# Patient Record
Sex: Female | Born: 1985
Health system: Southern US, Community
[De-identification: ages and names within clinical notes are randomized; demographics above are authoritative.]

## PROBLEM LIST (undated history)

## (undated) DIAGNOSIS — N920 Excessive and frequent menstruation with regular cycle: Secondary | ICD-10-CM

## (undated) DIAGNOSIS — L2089 Other atopic dermatitis: Secondary | ICD-10-CM

## (undated) DIAGNOSIS — D649 Anemia, unspecified: Secondary | ICD-10-CM

## (undated) DIAGNOSIS — E8809 Other disorders of plasma-protein metabolism, not elsewhere classified: Secondary | ICD-10-CM

## (undated) DIAGNOSIS — N1330 Unspecified hydronephrosis: Secondary | ICD-10-CM

## (undated) DIAGNOSIS — I10 Essential (primary) hypertension: Secondary | ICD-10-CM

## (undated) DIAGNOSIS — D219 Benign neoplasm of connective and other soft tissue, unspecified: Secondary | ICD-10-CM

## (undated) HISTORY — DX: Anemia, unspecified: D64.9

## (undated) HISTORY — DX: Benign neoplasm of connective and other soft tissue, unspecified: D21.9

## (undated) HISTORY — DX: Excessive and frequent menstruation with regular cycle: N92.0

---
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
## 2006-10-24 HISTORY — PX: WISDOM TOOTH EXTRACTION: SHX21

## 2010-04-22 ENCOUNTER — Emergency Department (HOSPITAL_COMMUNITY): Admission: EM | Admit: 2010-04-22 | Discharge: 2010-04-22 | Payer: Self-pay | Admitting: Family Medicine

## 2011-12-30 ENCOUNTER — Emergency Department (HOSPITAL_COMMUNITY)
Admission: EM | Admit: 2011-12-30 | Discharge: 2011-12-30 | Disposition: A | Discharge status: Home Health | Payer: 59 | Source: Home / Self Care | Attending: Emergency Medicine | Admitting: Emergency Medicine

## 2011-12-30 ENCOUNTER — Encounter (HOSPITAL_COMMUNITY): Payer: Self-pay | Admitting: Emergency Medicine

## 2011-12-30 DIAGNOSIS — N760 Acute vaginitis: Secondary | ICD-10-CM

## 2011-12-30 LAB — WET PREP, GENITAL: Trich, Wet Prep: NONE SEEN

## 2011-12-30 MED ORDER — METRONIDAZOLE 500 MG PO TABS: 500.0000 mg | ORAL_TABLET | Freq: Two times a day (BID) | ORAL | Status: AC

## 2011-12-30 MED ORDER — FLUCONAZOLE 150 MG PO TABS: ORAL_TABLET | ORAL | Status: AC

## 2011-12-30 NOTE — Discharge Instructions (Signed)
Take the medication as written. Give us a working phone number so that we can contact you if needed. Refrain from sexual contact until you know your results and your partner(s) are treated. Return if you get worse, have a fever >100.4, or for any concerns.  ° °Go to www.goodrx.com to look up your medications. This will give you a list of where you can find your prescriptions at the most affordable prices.  °

## 2011-12-30 NOTE — ED Provider Notes (Signed)
History     CSN: [id number]  Arrival date & time 12/30/11  0846   First MD Initiated Contact with Patient 12/30/11 [phone]      Chief Complaint  Patient presents with  . Vaginal Discharge    (Consider location/radiation/quality/duration/timing/severity/associated sxs/prior treatment) HPI Comments: Pt with 7 days vaginal itching, nonodorous vaginal discharge,  No urgency, frequency, dysuria, oderous urine, hematuria,  genital blisters. No fevers, N/V, abd pain, back pain. States that she was using a new scented soap, but has since discontinued this. Tried Monistat vaginal suppositories for 3 days, with mild improvement No recent abx use. Pt sexually active with longterm female partner who is asxatic. STD's not a concern today. No h/o BV gonorrhea chlamydia trichmonoas yeast infection. No h/o syphilis, herpes, HIV.  ROS as noted in HPI. All other ROS negative.    Patient is a 26 y.o. female presenting with vaginal itching. The history is provided by the patient.  Vaginal Itching    History reviewed. No pertinent past medical history.  History reviewed. No pertinent past surgical history.  History reviewed. No pertinent family history.  History  Substance Use Topics  . Smoking status: Never Smoker   . Smokeless tobacco: Not on file  . Alcohol Use: No    OB History    Grav Para Term Preterm Abortions TAB SAB Ect Mult Living                  Review of Systems  Allergies  Review of patient's allergies indicates no known allergies.  Home Medications   Current Outpatient Rx  Name Route Sig Dispense Refill  . FLUCONAZOLE 150 MG PO TABS  1 tab po x 1. May repeat in 72 hours if no improvement 2 tablet 0  . METRONIDAZOLE 500 MG PO TABS Oral Take 1 tablet (500 mg total) by mouth 2 (two) times daily. X 7 days 14 tablet 0    BP 161/84  Pulse 80  Temp(Src) 98.1 F (36.7 C) (Oral)  Resp 18  SpO2 100%  LMP 12/09/2011  Physical Exam  Nursing note and vitals  reviewed. Constitutional: She is oriented to person, place, and time. She appears well-developed and well-nourished. No distress.  HENT:  Head: Normocephalic and atraumatic.  Eyes: EOM are normal.  Neck: Normal range of motion.  Cardiovascular: Normal rate.   Pulmonary/Chest: Effort normal.  Abdominal: Soft. Bowel sounds are normal. She exhibits no distension. There is no tenderness. There is no rebound and no guarding.  Genitourinary: Uterus normal. Pelvic exam was performed with patient supine. There is no rash on the right labia. There is no rash on the left labia. Uterus is not tender. Cervix exhibits no motion tenderness and no friability. Right adnexum displays no mass, no tenderness and no fullness. Left adnexum displays no mass, no tenderness and no fullness. No erythema, tenderness or bleeding around the vagina. No foreign body around the vagina. Vaginal discharge found.       Thin white nonoderous vaginal d/c.Chaperone present during exam  Musculoskeletal: Normal range of motion.  Neurological: She is alert and oriented to person, place, and time.  Skin: Skin is warm and dry.  Psychiatric: She has a normal mood and affect. Her behavior is normal. Judgment and thought content normal.    ED Course  Procedures (including critical care time)   Labs Reviewed  GC/CHLAMYDIA PROBE AMP, GENITAL  WET PREP, GENITAL   No results found.   1. Vaginitis       MDM  Records reviewed.. No previous labs available.  H&P most c/w  BV. Sent off GC/chlamydia, wet prep. Will not treat empirically now.  Will send home with flagyl  and diflucan for yeast infection if her symptoms do not clear with Flagyl. Advised pt to refrain from sexual contact until she knows lab results, symptoms resolve, and partner(s) are treated. Pt provided working phone number. Pt agrees.     Luiz Blare, MD 12/30/11 1025

## 2011-12-30 NOTE — ED Notes (Signed)
C/o vaginal irritation, vaginal discharge for 1 week.. Used monistat suppositories without relief.  No recent antibiotics.

## 2011-12-31 LAB — GC/CHLAMYDIA PROBE AMP, GENITAL
Chlamydia, DNA Probe: NEGATIVE
GC Probe Amp, Genital: NEGATIVE

## 2012-01-02 NOTE — ED Notes (Signed)
GC/Chlamydia neg., Wet prep: Few clue cells, WBC's TNTC. Pt. adequately treated with Flagyl.  Alicia Larson 01/02/2012

## 2012-03-29 ENCOUNTER — Encounter: Payer: Self-pay | Admitting: Family Medicine

## 2012-03-29 ENCOUNTER — Other Ambulatory Visit: Payer: Self-pay | Admitting: Family Medicine

## 2012-03-29 ENCOUNTER — Ambulatory Visit (INDEPENDENT_AMBULATORY_CARE_PROVIDER_SITE_OTHER): Payer: 59 | Admitting: Family Medicine

## 2012-03-29 VITALS — BP 136/83 | HR 100 | Temp 97.8°F | Resp 18 | Ht 67.0 in | Wt 147.8 lb

## 2012-03-29 DIAGNOSIS — Z01419 Encounter for gynecological examination (general) (routine) without abnormal findings: Secondary | ICD-10-CM

## 2012-03-29 DIAGNOSIS — R5381 Other malaise: Secondary | ICD-10-CM

## 2012-03-29 DIAGNOSIS — R5383 Other fatigue: Secondary | ICD-10-CM

## 2012-03-29 DIAGNOSIS — Z124 Encounter for screening for malignant neoplasm of cervix: Secondary | ICD-10-CM

## 2012-03-29 DIAGNOSIS — Z975 Presence of (intrauterine) contraceptive device: Secondary | ICD-10-CM

## 2012-03-29 DIAGNOSIS — Z0000 Encounter for general adult medical examination without abnormal findings: Secondary | ICD-10-CM

## 2012-03-29 LAB — LIPID PANEL
LDL Cholesterol: 77 mg/dL (ref 0–99)
VLDL: 6 mg/dL (ref 0–40)

## 2012-03-29 LAB — POCT URINALYSIS DIPSTICK
Bilirubin, UA: NEGATIVE
Blood, UA: NEGATIVE
Glucose, UA: NEGATIVE
Ketones, UA: NEGATIVE
Leukocytes, UA: NEGATIVE
Nitrite, UA: NEGATIVE
pH, UA: 5.5

## 2012-03-29 LAB — CBC WITH DIFFERENTIAL/PLATELET
Basophils Absolute: 0 10*3/uL (ref 0.0–0.1)
Basophils Relative: 1 % (ref 0–1)
Hemoglobin: 7.2 g/dL — ABNORMAL LOW (ref 12.0–15.0)
MCHC: 27.4 g/dL — ABNORMAL LOW (ref 30.0–36.0)
Monocytes Relative: 12 % (ref 3–12)
Neutro Abs: 3.4 10*3/uL (ref 1.7–7.7)
Neutrophils Relative %: 57 % (ref 43–77)
Platelets: 256 10*3/uL (ref 150–400)
RDW: 22.8 % — ABNORMAL HIGH (ref 11.5–15.5)

## 2012-03-29 LAB — COMPREHENSIVE METABOLIC PANEL
AST: 20 U/L (ref 0–37)
Albumin: 4.5 g/dL (ref 3.5–5.2)
Alkaline Phosphatase: 72 U/L (ref 39–117)
Potassium: 3.9 mEq/L (ref 3.5–5.3)
Sodium: 136 mEq/L (ref 135–145)
Total Bilirubin: 0.4 mg/dL (ref 0.3–1.2)
Total Protein: 8.2 g/dL (ref 6.0–8.3)

## 2012-03-29 NOTE — Patient Instructions (Signed)
Vitamin D Deficiency  Not having enough vitamin D is called a deficiency. Your body needs this vitamin to keep your bones strong and healthy. Having too little of it can make your bones soft or can cause other health problems.  HOME CARE  Take all vitamins, herbs, or nutrition drinks (supplements) as told by your doctor.   Have your blood tested 2 months after taking vitamins, herbs, or nutrition drinks.   Eat foods that have vitamin D. This includes:   Dairy products, cereals, or juices with added vitamin D. Check the label.   Fatty fish like salmon or trout.   Eggs.   Oysters.   Go outside for 10 to 15 minutes when the sun is shining. Do this 3 times a week. Do not do this if you have skin cancer.   Do not use tanning beds.   Stay at a healthy weight. Lose weight if needed.   Keep all doctor visits as told.  GET HELP IF:  You have questions.   You continue to have problems.   You feel sick to your stomach (nauseous) or throw up (vomit).   You cannot go poop (constipated).   You feel confused.   You have severe belly (abdominal) or back pain.  MAKE SURE YOU:  Understand these instructions.   Will watch your condition.   Will get help right away if you are not doing well or get worse.  Document Released: 09/29/2011 Document Reviewed: 09/27/2011 [facility] Patient Information 2012 [place], [place]    .Keeping You Healthy  Get These Tests 1. Blood Pressure- Have your blood pressure checked once a year by your health care provider.  Normal blood pressure is 120/80. 2. Weight- Have your body mass index (BMI) calculated to screen for obesity.  BMI is measure of body fat based on height and weight.  You can also calculate your own BMI at https://www.west-esparza.com/. 3. Cholesterol- Have your cholesterol checked every 5 years starting at age 70 then yearly starting at age 69. 4. Chlamydia, HIV, and other sexually transmitted diseases- Get screened every year until age 40,  then within three months of each new sexual provider. 5. Pap Smear- Every 1-3 years; discuss with your health care provider. 6. Mammogram- Every year starting at age 43  Take these medicines  Calcium with Vitamin D-Your body needs 1200 mg of Calcium each day and [phone] IU of Vitamin D daily.  Your body can only absorb 500 mg of Calcium at a time so Calcium must be taken in 2 or 3 divided doses throughout the day.  Multivitamin with folic acid- Once daily if it is possible for you to become pregnant.   Start taking a multi-vitamin [employer] Made for Women with Iron  Is a good brand)  Get these Immunizations  Gardasil-Series of three doses; prevents HPV related illness such as genital warts and cervical cancer.  Menactra-Single dose; prevents meningitis.  Tetanus shot- Every 10 years.  Flu shot-Every year.  Take these steps 1. Do not smoke-Your healthcare provider can help you quit.  For tips on how to quit go to www.smokefree.gov or call 1-800 QUITNOW. 2. Be physically active- Exercise 5 days a week for at least 30 minutes.  If you are not already physically active, start slow and gradually work up to 30 minutes of moderate physical activity.  Examples of moderate activity include walking briskly, dancing, swimming, bicycling, etc. 3. Breast Cancer- A self breast exam every month is important for early detection of breast  cancer.  For more information and instruction on self breast exams, ask your healthcare provider or [email or web address]. 4. Eat a healthy diet- Eat a variety of healthy foods such as fruits, vegetables, whole grains, low fat milk, low fat cheeses, yogurt, lean meats, poultry and fish, beans, nuts, tofu, etc.  For more information go to www. Thenutritionsource.org 5. Drink alcohol in moderation- Limit alcohol intake to one drink or less per day. Never drink and drive. 6. Depression- Your emotional health is as important as your physical health.  If  you're feeling down or losing interest in things you normally enjoy please talk to your healthcare provider about being screened for depression. 7. Dental visit- Brush and floss your teeth twice daily; visit your dentist twice a year. 8. Eye doctor- Get an eye exam at least every 2 years. 9. Helmet use- Always wear a helmet when riding a bicycle, motorcycle, rollerblading or skateboarding. 10. Safe sex- If you may be exposed to sexually transmitted infections, use a condom. 11. Seat belts- Seat belts can save your live; always wear one. 12. Smoke/Carbon Monoxide detectors- These detectors need to be installed on the appropriate level of your home. Replace batteries at least once a year. 13. Skin cancer- When out in the sun please cover up and use sunscreen 15 SPF or higher. 14. Violence- If anyone is threatening or hurting you, please tell your healthcare provider.

## 2012-03-29 NOTE — Progress Notes (Signed)
Subjective:    Patient ID: Alicia Larson, female    DOB: 16-Sep-1986, 26 y.o.   MRN: [id number]  HPI   This 26 y.o. AA female is here for CPE/PAP, last PAP in 2009 in [place], [place]. 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 She c/o fatigue and occasional right knee discomfort that requires  no OTC pain medication or other treatment. No known injury. She is married; current contraception  is IUD Midwife). She does not smoke, consume alcohol or exercise.   Review of Systems  Constitutional: Positive for fatigue. Negative for fever, appetite change and unexpected weight change.  Respiratory: Negative for chest tightness and shortness of breath.   Cardiovascular: Negative for chest pain and palpitations.  Genitourinary: Negative for menstrual problem.  Musculoskeletal: Positive for arthralgias. Negative for joint swelling and gait problem.  Neurological: Negative for dizziness, weakness and headaches.  Hematological: Negative for adenopathy. Does not bruise/bleed easily.  All other systems reviewed and are negative.       Objective:   Physical Exam  Vitals reviewed. Constitutional: She is oriented to person, place, and time. She appears well-developed and well-nourished. No distress.  HENT:  Head: Normocephalic and atraumatic.  Right Ear: External ear normal.  Left Ear: External ear normal.  Nose: Nose normal.  Mouth/Throat: Oropharynx is clear and moist.  Eyes: Conjunctivae and EOM are normal. Pupils are equal, round, and reactive to light. No scleral icterus.  Neck: Normal range of motion. Neck supple. No thyromegaly present.  Cardiovascular: Normal rate, regular rhythm and normal heart sounds.  Exam reveals no gallop.   No murmur heard. Pulmonary/Chest: Effort normal and breath sounds normal. No respiratory distress. She has no wheezes. Right breast exhibits no inverted nipple, no mass, no nipple discharge, no skin change and no tenderness. Left breast exhibits no  inverted nipple, no mass, no nipple discharge, no skin change and no tenderness. Breasts are symmetrical.  Abdominal: Soft. Bowel sounds are normal. She exhibits no mass. There is no tenderness. There is no guarding.       No organomegaly  Genitourinary: Rectum normal, vagina normal and uterus normal. There is no rash, tenderness or lesion on the right labia. There is no rash, tenderness or lesion on the left labia. Cervix exhibits discharge. Cervix exhibits no motion tenderness and no friability. Right adnexum displays no mass, no tenderness and no fullness. Left adnexum displays no mass, no tenderness and no fullness. No tenderness or bleeding around the vagina. No foreign body around the vagina. No signs of injury around the vagina.       Scant discharge on cervix  Musculoskeletal: Normal range of motion. She exhibits no edema and no tenderness.  Lymphadenopathy:    She has no cervical adenopathy.  Neurological: She is alert and oriented to person, place, and time. She has normal reflexes. No cranial nerve deficit. She exhibits normal muscle tone. Coordination normal.  Skin: Skin is warm and dry. No erythema. No pallor.  Psychiatric: She has a normal mood and affect. Her behavior is normal. Judgment and thought content normal.   Results for orders placed in visit on 03/29/12  POCT URINALYSIS DIPSTICK      Component Value Range   Color, UA yellow     Clarity, UA clear     Glucose, UA neg     Bilirubin, UA neg     Ketones, UA neg     Spec Grav, UA 1.015     Blood, UA neg  pH, UA 5.5     Protein, UA neg     Urobilinogen, UA 0.2     Nitrite, UA neg     Leukocytes, UA Negative            Assessment & Plan:   1. Routine general medical examination at a health care facility  Comprehensive metabolic panel, Lipid panel, POCT urinalysis dipstick  2. Encounter for cervical Pap smear with pelvic exam  Pap IG, CT/NG w/ reflex HPV when ASC-U  3. Fatigue - advised a good MVI (i.e. [employer]  Made for Women with Iron) CBC with Differential, Vitamin D, 25-hydroxy

## 2012-03-30 ENCOUNTER — Other Ambulatory Visit: Payer: Self-pay | Admitting: Family Medicine

## 2012-03-30 DIAGNOSIS — D649 Anemia, unspecified: Secondary | ICD-10-CM

## 2012-03-30 LAB — IRON AND TIBC
%SAT: 2 % — ABNORMAL LOW (ref 20–55)
TIBC: 517 ug/dL — ABNORMAL HIGH (ref 250–470)
UIBC: 505 ug/dL — ABNORMAL HIGH (ref 125–400)

## 2012-03-30 LAB — RETICULOCYTES: ABS Retic: 48.2 10*3/uL (ref 19.0–186.0)

## 2012-03-30 MED ORDER — POLYSACCHARIDE IRON COMPLEX 150 MG PO CAPS: 150.0000 mg | ORAL_CAPSULE | Freq: Two times a day (BID) | ORAL | Status: DC

## 2012-03-30 MED ORDER — ERGOCALCIFEROL 1.25 MG (50000 UT) PO CAPS: 50000.0000 [IU] | ORAL_CAPSULE | ORAL | Status: AC

## 2012-03-30 NOTE — Progress Notes (Signed)
Quick Note:  Please call pt and advise that the following labs are abnormal...  Your are very anemic. We are ordering additional tests to see if this is an iron-deficiency anemia. I am prescribing iron supplement to be taken twice a day and CBC needs to be rechecked in 1 month.  Vitamin D level is very low. Also need to take Vit D3 50,000 IU 1 capsule once a week through the summer.  Other labs are normal.  Copy to pt.  ______

## 2012-03-30 NOTE — Progress Notes (Signed)
Pt.notified

## 2012-04-02 LAB — PAP IG, CT-NG, RFX HPV ASCU

## 2012-04-03 NOTE — Progress Notes (Signed)
Quick Note:  Please call pt and advise that the following labs are abnormal... Additional labs indicate iron-deficiency anemia. Take Iron supplement daily as directed and increase iron-rich foods in diet.   Copy to pt. ______

## 2012-04-03 NOTE — Progress Notes (Signed)
Quick Note:  Notify pt of Normal results. ______ 

## 2012-07-06 ENCOUNTER — Telehealth: Payer: Self-pay

## 2012-07-06 ENCOUNTER — Ambulatory Visit (INDEPENDENT_AMBULATORY_CARE_PROVIDER_SITE_OTHER): Payer: 59 | Admitting: Family Medicine

## 2012-07-06 ENCOUNTER — Encounter: Payer: Self-pay | Admitting: Family Medicine

## 2012-07-06 VITALS — BP 131/78 | HR 82 | Temp 96.7°F | Resp 16 | Ht 67.0 in | Wt 150.0 lb

## 2012-07-06 DIAGNOSIS — B354 Tinea corporis: Secondary | ICD-10-CM

## 2012-07-06 MED ORDER — KETOCONAZOLE-HYDROCORTISONE 2 & 1 % EX KIT: 1.0000 "application " | PACK | Freq: Every day | CUTANEOUS | Status: DC

## 2012-07-06 NOTE — Progress Notes (Signed)
  Subjective:    Patient ID: Alicia Larson, female    DOB: 03/23/1986, 26 y.o.   MRN: [id number]  HPI   For the past mo, pt has gradually developed a dry rash - started w/ several spots on her breasts. She thought it was just dry skin so aggressively moisturized but then developed spots on her abdomen. Now having worst areas on her thighs and upper back. Just seem to be dry rough spots but will get itchy if not moisturized. No h/o prior. No pets. Husband w/o similar rash. Has not tried any otc products other than hydrating lotions. Tried changing her soaps and detergents w/o improvement. 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   Review of Systems  Constitutional: Negative for fever and chills.  Musculoskeletal: Negative for joint swelling and arthralgias.  Skin: Positive for rash. Negative for color change.       Objective:   Physical Exam  Constitutional: She is oriented to person, place, and time. She appears well-developed and well-nourished. No distress.  HENT:  Head: Normocephalic and atraumatic.  Eyes: Pupils are equal, round, and reactive to light. No scleral icterus.  Pulmonary/Chest: Effort normal.  Neurological: She is alert and oriented to person, place, and time.  Skin: Skin is warm and dry. Rash noted. Rash is macular. She is not diaphoretic. No erythema. No pallor.       Diffuse, well-circumscribed round mildly hyperpigmented macules w/ scale, some w/ central clearing. Most prominent on low and upper back and thigh. On breast and abdomen appeared to be resolving. Ranging in size from 0.5-2cm dm   Psychiatric: She has a normal mood and affect. Her behavior is normal.          Assessment & Plan:  1. Tinea corporis - KOH skin scraping + for hyphae. Treat w/ topical antifungal - ketoconazole qd x 6 wks.

## 2012-07-06 NOTE — Telephone Encounter (Signed)
PATIENT CALLED TODAY AND SAID [facility] NEEDS CLARIFICATION ON MEDICATION THAT WAS PRESCRIBED BY DR. SHAW  TO PATIENT. PLEASE GET IN CONTACT WITH PHARMACY IN RAGARDS TO THIS. THEY HAVE CLAIMED TO FAX INFO ON RX BUT THERE HAS BEEN NO FAX TODAY SENT TO [facility]. THANK YOU!

## 2012-07-07 ENCOUNTER — Other Ambulatory Visit: Payer: Self-pay

## 2012-07-07 NOTE — Telephone Encounter (Signed)
Spoke with pharmacist clarified medication, per Alycia Rossetti ok to change to ketaconazole 2% cream

## 2012-07-10 NOTE — Progress Notes (Signed)
Reviewed and agree.

## 2012-08-17 ENCOUNTER — Ambulatory Visit (INDEPENDENT_AMBULATORY_CARE_PROVIDER_SITE_OTHER): Payer: 59 | Admitting: Family Medicine

## 2012-08-17 ENCOUNTER — Encounter: Payer: Self-pay | Admitting: Family Medicine

## 2012-08-17 VITALS — BP 122/62 | HR 84 | Temp 98.6°F | Resp 16 | Ht 66.5 in | Wt 148.6 lb

## 2012-08-17 DIAGNOSIS — B360 Pityriasis versicolor: Secondary | ICD-10-CM

## 2012-08-17 DIAGNOSIS — B351 Tinea unguium: Secondary | ICD-10-CM

## 2012-08-17 DIAGNOSIS — R21 Rash and other nonspecific skin eruption: Secondary | ICD-10-CM

## 2012-08-17 LAB — POCT SKIN KOH: Skin KOH, POC: POSITIVE

## 2012-08-17 MED ORDER — FLUCONAZOLE 200 MG PO TABS: 400.0000 mg | ORAL_TABLET | Freq: Once | ORAL | Status: DC

## 2012-08-17 MED ORDER — KETOCONAZOLE 2 % EX CREA: TOPICAL_CREAM | CUTANEOUS | Status: DC

## 2012-08-17 NOTE — Progress Notes (Signed)
Subjective:    Patient ID: Alicia Larson, female    DOB: Dec 12, 1985, 26 y.o.   MRN: [id number] Chief Complaint  Patient presents with  . Follow-up    RASH    HPI  Rash seems to be spreading. She has new spots now on bilateral lower ext extending to her feet. Over her trunk too but worse on her legs.  She has been using the ketoconazole/hydrocortisone cream daily - which I started her on about 6 wks ago after a skin scraping was + for hyphae which initially seemed to be helping but now much worse.  No past medical history on file. Current Outpatient Prescriptions on File Prior to Visit  Medication Sig Dispense Refill  . ergocalciferol (DRISDOL) 50000 UNITS capsule Take 1 capsule (50,000 Units total) by mouth once a week.  4 capsule  4  . iron polysaccharides (NU-IRON) 150 MG capsule Take 1 capsule (150 mg total) by mouth 2 (two) times daily.  60 capsule  3   No current facility-administered medications on file prior to visit.   No Known Allergies  Review of Systems  Constitutional: Negative for fever, chills and diaphoresis.  Musculoskeletal: Negative for joint swelling and arthralgias.  Skin: Positive for color change and rash. Negative for pallor and wound.  Hematological: Negative for adenopathy. Does not bruise/bleed easily.  Psychiatric/Behavioral: Positive for sleep disturbance.      BP 122/62  Pulse 84  Temp(Src) 98.6 F (37 C) (Oral)  Resp 16  Ht 5' 6.5" (1.689 m)  Wt 148 lb 9.6 oz (67.405 kg)  BMI 23.63 kg/m2  SpO2 100%  LMP 08/16/2012 Objective:   Physical Exam  Constitutional: She is oriented to person, place, and time. She appears well-developed and well-nourished. No distress.  HENT:  Head: Normocephalic and atraumatic.  Right Ear: External ear normal.  Eyes: Conjunctivae are normal. No scleral icterus.  Pulmonary/Chest: Effort normal.  Neurological: She is alert and oriented to person, place, and time.  Skin: Skin is warm and dry. She is not diaphoretic.  No erythema.  Diffuse, well-circumscribed round mildly hyperpigmented macules w/ scale, some w/ central clearing. Most prominent on low and upper back and thigh though spread over entire bilateral low ext. On breast and abdomen and trunk appeared to be resolving. Ranging in size from 0.5-2cm dm. Did not light up sig under wood's lamp.    Psychiatric: She has a normal mood and affect. Her behavior is normal.      Results for orders placed in visit on 08/17/12  POCT SKIN KOH      Result Value Range   Skin KOH, POC Positive      Assessment & Plan:  Rash - Plan: POCT Skin KOH  Tinea versicolor - Plan: fluconazole (DIFLUCAN) 200 MG tablet, ketoconazole (NIZORAL) 2 % cream - Start using a topical shampoo or body wash containing selenium sulfide 2.5% and wash all over - head to toe and everywhere inbetween daily for a week. Worsening but again positive for yeast so remove steroid component and try oral treatment as well as body wash to more effectively cover. If cont to worsen, refer to derm.   Meds ordered this encounter  Medications  . fluconazole (DIFLUCAN) 200 MG tablet    Sig: Take 2 tablets (400 mg total) by mouth once. Repeat if needed    Dispense:  2 tablet    Refill:  1  . ketoconazole (NIZORAL) 2 % cream    Sig: Apply topically once to twice daily to  entire body for 2 wks    Dispense:  75 g    Refill:  2

## 2012-08-17 NOTE — Patient Instructions (Addendum)
Start using a topical shampoo or body wash containing selenium sulfide 2.5% and wash all over - head to toe and everywhere inbetween daily for a week.   Tinea Versicolor Tinea versicolor is a common yeast infection of the skin. This condition becomes known when the yeast on our skin starts to overgrow (yeast is a normal inhabitant on our skin). This condition is noticed as white or light brown patches on brown skin, and is more evident in the summer on tanned skin. These areas are slightly scaly if scratched. The light patches from the yeast become evident when the yeast creates "holes in your suntan". This is most often noticed in the summer. The patches are usually located on the chest, back, pubis, neck and body folds. However, it may occur on any area of body. Mild itching and inflammation (redness or soreness) may be present. DIAGNOSIS  The diagnosisof this is made clinically (by looking). Cultures from samples are usually not needed. Examination under the microscope may help. However, yeast is normally found on skin. The diagnosis still remains clinical. Examination under Wood's Ultraviolet Light can determine the extent of the infection. TREATMENT  This common infection is usually only of cosmetic (only a concern to your appearance). It is easily treated with dandruff shampoo used during showers or bathing. Vigorous scrubbing will eliminate the yeast over several days time. The light areas in your skin may remain for weeks or months after the infection is cured unless your skin is exposed to sunlight. The lighter or darker spots caused by the fungus that remain after complete treatment are not a sign of treatment failure; it will take a long time to resolve. Your caregiver may recommend a number of commercial preparations or medication by mouth if home care is not working. Recurrence is common and preventative medication may be necessary. This skin condition is not highly contagious. Special care is  not needed to protect close friends and family members. Normal hygiene is usually enough. Follow up is required only if you develop complications (such as a secondary infection from scratching), if recommended by your caregiver, or if no relief is obtained from the preparations used. Document Released: 10/07/2000 Document Revised: 01/02/2012 Document Reviewed: 11/19/2008 [facility] Patient Information 2013 [place], [place].

## 2013-01-25 ENCOUNTER — Ambulatory Visit (INDEPENDENT_AMBULATORY_CARE_PROVIDER_SITE_OTHER): Payer: 59 | Admitting: Emergency Medicine

## 2013-01-25 VITALS — BP 112/70 | HR 79 | Temp 99.1°F | Resp 16 | Ht 67.5 in | Wt 143.0 lb

## 2013-01-25 DIAGNOSIS — H109 Unspecified conjunctivitis: Secondary | ICD-10-CM

## 2013-01-25 MED ORDER — OFLOXACIN 0.3 % OP SOLN: 1.0000 [drp] | OPHTHALMIC | Status: DC

## 2013-01-25 NOTE — Patient Instructions (Addendum)
Conjunctivitis Conjunctivitis is commonly called "pink eye." Conjunctivitis can be caused by bacterial or viral infection, allergies, or injuries. There is usually redness of the lining of the eye, itching, discomfort, and sometimes discharge. There may be deposits of matter along the eyelids. A viral infection usually causes a watery discharge, while a bacterial infection causes a yellowish, thick discharge. Pink eye is very contagious and spreads by direct contact. You may be given antibiotic eyedrops as part of your treatment. Before using your eye medicine, remove all drainage from the eye by washing gently with warm water and cotton balls. Continue to use the medication until you have awakened 2 mornings in a row without discharge from the eye. Do not rub your eye. This increases the irritation and helps spread infection. Use separate towels from other household members. Wash your hands with soap and water before and after touching your eyes. Use cold compresses to reduce pain and sunglasses to relieve irritation from light. Do not wear contact lenses or wear eye makeup until the infection is gone. SEEK MEDICAL CARE IF:   Your symptoms are not better after 3 days of treatment.  You have increased pain or trouble seeing.  The outer eyelids become very red or swollen. Document Released: 11/17/2004 Document Revised: 01/02/2012 Document Reviewed: 10/10/2005 [employer] Patient Information 2013 [employer].  

## 2013-01-25 NOTE — Progress Notes (Signed)
  Subjective:    Patient ID: Alicia Larson, female    DOB: Mar 04, 1986, 27 y.o.   MRN: [id number]  HPI patient comes in today with complaints of her left eye pain and drainage. This started this morning  Patient wears contacts on a regular basic.Had a cold for the last three days    Review of Systems  Eyes: Positive for pain, discharge and redness. Negative for visual disturbance.       Objective:   Physical Exam pupils equal and react to light direct and consensual. Extraocular movements are intact the disc margin is sharp on the left. There is redness of the conjunctiva. Leave the eversion is negative. There is no pre-auricular node present. There is no photophobia noted .        Assessment & Plan:     We'll treat with Ocuflox 1-2 drops in the left eye 4 times a day.

## 2013-02-22 ENCOUNTER — Ambulatory Visit (INDEPENDENT_AMBULATORY_CARE_PROVIDER_SITE_OTHER): Payer: 59 | Admitting: Family Medicine

## 2013-02-22 ENCOUNTER — Encounter: Payer: Self-pay | Admitting: Family Medicine

## 2013-02-22 VITALS — BP 124/72 | HR 86 | Temp 98.5°F | Resp 16 | Ht 67.0 in | Wt 142.8 lb

## 2013-02-22 DIAGNOSIS — D509 Iron deficiency anemia, unspecified: Secondary | ICD-10-CM

## 2013-02-22 DIAGNOSIS — B360 Pityriasis versicolor: Secondary | ICD-10-CM

## 2013-02-22 LAB — CBC WITH DIFFERENTIAL/PLATELET
Eosinophils Absolute: 0.1 10*3/uL (ref 0.0–0.7)
Hemoglobin: 5.7 g/dL — CL (ref 12.0–15.0)
Lymphocytes Relative: 34 % (ref 12–46)
Lymphs Abs: 1 10*3/uL (ref 0.7–4.0)
MCH: 15 pg — ABNORMAL LOW (ref 26.0–34.0)
Monocytes Relative: 16 % — ABNORMAL HIGH (ref 3–12)
Neutro Abs: 1.5 10*3/uL — ABNORMAL LOW (ref 1.7–7.7)
Neutrophils Relative %: 48 % (ref 43–77)
Platelets: 412 10*3/uL — ABNORMAL HIGH (ref 150–400)
RBC: 3.81 MIL/uL — ABNORMAL LOW (ref 3.87–5.11)
WBC: 3.1 10*3/uL — ABNORMAL LOW (ref 4.0–10.5)

## 2013-02-22 LAB — FERRITIN: Ferritin: 1 ng/mL — ABNORMAL LOW (ref 10–291)

## 2013-02-22 MED ORDER — SELENIUM SULFIDE 2.5 % EX LOTN: TOPICAL_LOTION | Freq: Every day | CUTANEOUS | Status: DC

## 2013-02-22 MED ORDER — FLUCONAZOLE 200 MG PO TABS: 400.0000 mg | ORAL_TABLET | Freq: Once | ORAL | Status: DC

## 2013-02-25 NOTE — Progress Notes (Signed)
  Subjective:    Patient ID: Alicia Larson, female    DOB: 1986-07-14, 27 y.o.   MRN: [id number] Chief Complaint  Patient presents with  . Follow-up    HPI    Review of Systems    BP 124/72  Pulse 86  Temp(Src) 98.5 F (36.9 C) (Oral)  Resp 16  Ht 5\' 7"  (1.702 m)  Wt 142 lb 12.8 oz (64.774 kg)  BMI 22.36 kg/m2  SpO2 100%  LMP 02/16/2013 Objective:   Physical Exam        Assessment & Plan:  Tinea versicolor - Plan: fluconazole (DIFLUCAN) 200 MG tablet, Ambulatory referral to Dermatology  Iron deficiency anemia - Plan: Ferritin, Vitamin D 25 hydroxy, CBC with Differential  Meds ordered this encounter  Medications  . selenium sulfide (SELSUN) 2.5 % shampoo    Sig: Apply topically daily.    Dispense:  120 mL    Refill:  12  . fluconazole (DIFLUCAN) 200 MG tablet    Sig: Take 2 tablets (400 mg total) by mouth once. Repeat if needed    Dispense:  2 tablet    Refill:  1

## 2013-04-22 ENCOUNTER — Ambulatory Visit (INDEPENDENT_AMBULATORY_CARE_PROVIDER_SITE_OTHER): Payer: 59 | Admitting: Emergency Medicine

## 2013-04-22 VITALS — BP 125/78 | HR 76 | Temp 98.5°F | Resp 16 | Ht 66.5 in | Wt 140.0 lb

## 2013-04-22 DIAGNOSIS — D649 Anemia, unspecified: Secondary | ICD-10-CM

## 2013-04-22 LAB — POCT CBC
Lymph, poc: 1.4 (ref 0.6–3.4)
MCH, POC: 17 pg — AB (ref 27–31.2)
MCHC: 27.8 g/dL — AB (ref 31.8–35.4)
MCV: 60.9 fL — AB (ref 80–97)
MID (cbc): 0.3 (ref 0–0.9)
POC LYMPH PERCENT: 30.9 %L (ref 10–50)
POC MID %: 7.5 %M (ref 0–12)
Platelet Count, POC: 422 10*3/uL (ref 142–424)
RBC: 5.07 M/uL (ref 4.04–5.48)
RDW, POC: 26.9 %
WBC: 4.6 10*3/uL (ref 4.6–10.2)

## 2013-04-22 MED ORDER — POLYSACCHARIDE IRON COMPLEX 150 MG PO CAPS: 150.0000 mg | ORAL_CAPSULE | Freq: Two times a day (BID) | ORAL | Status: DC

## 2013-04-22 NOTE — Progress Notes (Signed)
[facility] [address] [place] [address] [phone]  Date:  04/22/2013   Name:  Alicia Larson   DOB:  1986/08/19   MRN:  [id number]  PCP:  Norberto Sorenson, MD    Chief Complaint: Medication Refill   History of Present Illness:  Alicia Larson is a 27 y.o. very pleasant female patient who presents with the following:  Seen last for fatigue and found to have a critically low hemoglobin and put on high dose iron supplementation.  Tolerating the medication well and her fatigue has resolved. No unusual bleeding.  No improvement with over the counter medications or other home remedies. Denies other complaint or health concern today.   Patient Active Problem List   Diagnosis Date Noted  . Health care maintenance 03/29/2012  . Contraception, device intrauterine 03/29/2012    Past Medical History  Diagnosis Date  . Anemia     Past Surgical History  Procedure Laterality Date  . Wisdom tooth extraction  2008    History  Substance Use Topics  . Smoking status: Never Smoker   . Smokeless tobacco: Not on file  . Alcohol Use: No    Family History  Problem Relation Age of Onset  . Diabetes Mother   . Hypertension Mother   . Hypertension Maternal Grandfather   . Hyperlipidemia Maternal Grandfather     No Known Allergies  Medication list has been reviewed and updated.  Current Outpatient Prescriptions on File Prior to Visit  Medication Sig Dispense Refill  . fluconazole (DIFLUCAN) 200 MG tablet Take 2 tablets (400 mg total) by mouth once. Repeat if needed  2 tablet  1  . iron polysaccharides (NU-IRON) 150 MG capsule Take 1 capsule (150 mg total) by mouth 2 (two) times daily.  60 capsule  3  . ofloxacin (OCUFLOX) 0.3 % ophthalmic solution Place 1 drop into the left eye every 4 (four) hours.  5 mL  0  . selenium sulfide (SELSUN) 2.5 % shampoo Apply topically daily.  120 mL  12   No current facility-administered medications on file prior to visit.     Review of Systems:  As per HPI, otherwise negative.    Physical Examination: Filed Vitals:   04/22/13 1203  BP: 125/78  Pulse: 76  Temp: 98.5 F (36.9 C)  Resp: 16   Filed Vitals:   04/22/13 1203  Height: 5' 6.5" (1.689 m)  Weight: 140 lb (63.504 kg)   Body mass index is 22.26 kg/(m^2). Ideal Body Weight: Weight in (lb) to have BMI = 25: 156.9   GEN: WDWN, NAD, Non-toxic, Alert & Oriented x 3 HEENT: Atraumatic, Normocephalic.  Ears and Nose: No external deformity. EXTR: No clubbing/cyanosis/edema NEURO: Normal gait.  PSYCH: Normally interactive. Conversant. Not depressed or anxious appearing.  Calm demeanor.      Assessment and Plan Anemia Continue meds   Signed,  Phillips Odor, MD   Results for orders placed in visit on 04/22/13  POCT CBC      Result Value Range   WBC 4.6  4.6 - 10.2 K/uL   Lymph, poc 1.4  0.6 - 3.4   POC LYMPH PERCENT 30.9  10 - 50 %L   MID (cbc) 0.3  0 - 0.9   POC MID % 7.5  0 - 12 %M   POC Granulocyte 2.8  2 - 6.9   Granulocyte percent 61.6  37 - 80 %G   RBC 5.07  4.04 - 5.48 M/uL   Hemoglobin 8.6 (*)  12.2 - 16.2 g/dL   HCT, POC 16.1 (*) 09.6 - 47.9 %   MCV 60.9 (*) 80 - 97 fL   MCH, POC 17.0 (*) 27 - 31.2 pg   MCHC 27.8 (*) 31.8 - 35.4 g/dL   RDW, POC 04.5     Platelet Count, POC 422  142 - 424 K/uL   MPV    0 - 99.8 fL

## 2013-05-16 ENCOUNTER — Encounter: Payer: Self-pay | Admitting: *Deleted

## 2013-05-16 DIAGNOSIS — L2089 Other atopic dermatitis: Secondary | ICD-10-CM | POA: Insufficient documentation

## 2013-05-16 HISTORY — DX: Other atopic dermatitis: L20.89

## 2013-08-29 ENCOUNTER — Other Ambulatory Visit: Payer: Self-pay

## 2015-09-03 ENCOUNTER — Encounter: Payer: Self-pay | Admitting: Obstetrics & Gynecology

## 2015-09-03 ENCOUNTER — Ambulatory Visit (INDEPENDENT_AMBULATORY_CARE_PROVIDER_SITE_OTHER): Payer: 59 | Admitting: Obstetrics & Gynecology

## 2015-09-03 VITALS — BP 135/83 | HR 99 | Ht 67.0 in | Wt 151.0 lb

## 2015-09-03 DIAGNOSIS — Z0000 Encounter for general adult medical examination without abnormal findings: Secondary | ICD-10-CM

## 2015-09-03 DIAGNOSIS — Z01419 Encounter for gynecological examination (general) (routine) without abnormal findings: Secondary | ICD-10-CM

## 2015-09-03 DIAGNOSIS — N920 Excessive and frequent menstruation with regular cycle: Secondary | ICD-10-CM | POA: Insufficient documentation

## 2015-09-03 DIAGNOSIS — Z30431 Encounter for routine checking of intrauterine contraceptive device: Secondary | ICD-10-CM | POA: Diagnosis not present

## 2015-09-03 DIAGNOSIS — Z1151 Encounter for screening for human papillomavirus (HPV): Secondary | ICD-10-CM | POA: Diagnosis not present

## 2015-09-03 DIAGNOSIS — Z124 Encounter for screening for malignant neoplasm of cervix: Secondary | ICD-10-CM

## 2015-09-03 HISTORY — DX: Excessive and frequent menstruation with regular cycle: N92.0

## 2015-09-03 LAB — TSH: TSH: 1.022 u[IU]/mL (ref 0.350–4.500)

## 2015-09-03 NOTE — Progress Notes (Signed)
Subjective:    Alicia Larson is a 29 y.o. D AA P0 female who presents for an annual exam. The patient has no complaints today except that her periods have been heavy, especially over the last few years. she bleeds for 7 days per month. She has not had a work up for this but thinks that she is anemic over the last few years. The patient is sexually active. GYN screening history: last pap: was normal. The patient wears seatbelts: yes. The patient participates in regular exercise: no. Has the patient ever been transfused or tattooed?: no. The patient reports that there is not domestic violence in her life.   Menstrual History: OB History    Gravida Para Term Preterm AB TAB SAB Ectopic Multiple Living   0 0 0 0 0 0 0 0 0 0       Menarche age: 73   Patient's last menstrual period was 08/06/2015.    The following portions of the patient's history were reviewed and updated as appropriate: allergies, current medications, past family history, past medical history, past social history, past surgical history and problem list.  Review of Systems Pertinent items noted in HPI and remainder of comprehensive ROS otherwise negative. 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 Denies dyspareunia. She has had the Copper T IUD in since 2009. Her periods didn't get heavier until about 1-2 years ago.   Objective:    BP 135/83 mmHg  Pulse 99  Ht 5\' 7"  (1.702 m)  Wt 151 lb (68.493 kg)  BMI 23.64 kg/m2  LMP 08/06/2015  General Appearance:    Alert, cooperative, no distress, appears stated age  Head:    Normocephalic, without obvious abnormality, atraumatic  Eyes:    PERRL, conjunctiva/corneas clear, EOM's intact, fundi    benign, both eyes  Ears:    Normal TM's and external ear canals, both ears  Nose:   Nares normal, septum midline, mucosa normal, no drainage    or sinus tenderness  Throat:   Lips, mucosa, and tongue normal; teeth and gums normal  Neck:   Supple,  symmetrical, trachea midline, no adenopathy;    thyroid:  no enlargement/tenderness/nodules; no carotid   bruit or JVD  Back:     Symmetric, no curvature, ROM normal, no CVA tenderness  Lungs:     Clear to auscultation bilaterally, respirations unlabored  Chest Wall:    No tenderness or deformity   Heart:    Regular rate and rhythm, S1 and S2 normal, no murmur, rub   or gallop  Breast Exam:    No tenderness, masses, or nipple abnormality  Abdomen:     Soft, non-tender, bowel sounds active all four quadrants,    no masses, no organomegaly, uterus palpable  Genitalia:    Normal female without lesion, discharge or tenderness, 16 week size, mobile uterus     Extremities:   Extremities normal, atraumatic, no cyanosis or edema  Pulses:   2+ and symmetric all extremities  Skin:   Skin color, texture, turgor normal, no rashes or lesions  Lymph nodes:   Cervical, supraclavicular, and axillary nodes normal  Neurologic:   CNII-XII intact, normal strength, sensation and reflexes    throughout  .    Assessment:    Healthy female exam.   Menorrhagia, probable fibroids   Plan:     Breast self exam technique reviewed and patient encouraged to perform self-exam monthly. Thin prep Pap smear. with cotesting Gyn u/s,  cbc, tsh

## 2015-09-04 LAB — CBC
HCT: 34.4 % — ABNORMAL LOW (ref 36.0–46.0)
Hemoglobin: 10.1 g/dL — ABNORMAL LOW (ref 12.0–15.0)
MCH: 19 pg — ABNORMAL LOW (ref 26.0–34.0)
MCHC: 29.4 g/dL — ABNORMAL LOW (ref 30.0–36.0)
MCV: 64.8 fL — AB (ref 78.0–100.0)
PLATELETS: 375 10*3/uL (ref 150–400)
RBC: 5.31 MIL/uL — AB (ref 3.87–5.11)
RDW: 22.6 % — ABNORMAL HIGH (ref 11.5–15.5)
WBC: 5.1 10*3/uL (ref 4.0–10.5)

## 2015-09-04 LAB — CYTOLOGY - PAP

## 2015-09-08 ENCOUNTER — Ambulatory Visit (HOSPITAL_COMMUNITY)
Admission: RE | Admit: 2015-09-08 | Discharge: 2015-09-08 | Disposition: A | Discharge status: Home / Self Care | Payer: 59 | Source: Ambulatory Visit | Attending: Obstetrics & Gynecology | Admitting: Obstetrics & Gynecology

## 2015-09-08 DIAGNOSIS — N920 Excessive and frequent menstruation with regular cycle: Secondary | ICD-10-CM | POA: Diagnosis not present

## 2015-09-08 DIAGNOSIS — Z30431 Encounter for routine checking of intrauterine contraceptive device: Secondary | ICD-10-CM | POA: Insufficient documentation

## 2015-09-08 DIAGNOSIS — D259 Leiomyoma of uterus, unspecified: Secondary | ICD-10-CM | POA: Insufficient documentation

## 2015-09-16 ENCOUNTER — Encounter: Payer: Self-pay | Admitting: Obstetrics & Gynecology

## 2015-09-16 ENCOUNTER — Ambulatory Visit (INDEPENDENT_AMBULATORY_CARE_PROVIDER_SITE_OTHER): Payer: 59 | Admitting: Obstetrics & Gynecology

## 2015-09-16 VITALS — BP 135/87 | HR 77 | Ht 67.0 in | Wt 151.0 lb

## 2015-09-16 DIAGNOSIS — D259 Leiomyoma of uterus, unspecified: Secondary | ICD-10-CM | POA: Diagnosis not present

## 2015-09-16 DIAGNOSIS — D219 Benign neoplasm of connective and other soft tissue, unspecified: Secondary | ICD-10-CM | POA: Insufficient documentation

## 2015-09-16 DIAGNOSIS — N920 Excessive and frequent menstruation with regular cycle: Secondary | ICD-10-CM

## 2015-09-16 HISTORY — DX: Benign neoplasm of connective and other soft tissue, unspecified: D21.9

## 2015-09-16 MED ORDER — TRANEXAMIC ACID 650 MG PO TABS: 1300.0000 mg | ORAL_TABLET | Freq: Three times a day (TID) | ORAL | Status: DC

## 2015-09-16 MED ORDER — IBUPROFEN 800 MG PO TABS: 800.0000 mg | ORAL_TABLET | Freq: Three times a day (TID) | ORAL | Status: DC | PRN

## 2015-09-16 NOTE — Patient Instructions (Signed)
Return to clinic for any scheduled appointments or for any gynecologic concerns as needed.   

## 2015-09-16 NOTE — Progress Notes (Signed)
CLINIC ENCOUNTER NOTE  History:  29 y.o. G0P0000 here today for follow up after ultrasound done for enlarged uterus and menorrhagia. She denies any abnormal vaginal discharge, pelvic pain or other acute concerns.   Past Medical History  Diagnosis Date  . Anemia   . Fibroids 09/16/2015  . Menorrhagia 09/03/2015    Past Surgical History  Procedure Laterality Date  . Wisdom tooth extraction  2008    The following portions of the patient's history were reviewed and updated as appropriate: allergies, current medications, past family history, past medical history, past social history, past surgical history and problem list.   Health Maintenance:  Normal pap and negative HRHPV on 09/03/15.    Review of Systems:  Pertinent items noted in HPI and remainder of comprehensive ROS otherwise negative.  Objective:  Physical Exam BP 135/87 mmHg  Pulse 77  Ht 5\' 7"  (1.702 m)  Wt 151 lb (68.493 kg)  BMI 23.64 kg/m2  LMP 09/08/2015 CONSTITUTIONAL: Well-developed, well-nourished female in no acute distress.  HENT:  Normocephalic, atraumatic. External right and left ear normal. Oropharynx is clear and moist EYES: Conjunctivae and EOM are normal. Pupils are equal, round, and reactive to light. No scleral icterus.  NECK: Normal range of motion, supple, no masses SKIN: Skin is warm and dry. No rash noted. Not diaphoretic. No erythema. No pallor. [place]: Alert and oriented to person, place, and time. Normal reflexes, muscle tone coordination. No cranial nerve deficit noted. PSYCHIATRIC: Normal mood and affect. Normal behavior. Normal judgment and thought content. CARDIOVASCULAR: Normal heart rate noted RESPIRATORY: Effort and breath sounds normal, no problems with respiration noted ABDOMEN: Soft, no distention noted on evaluation.   PELVIC: Deferred MUSCULOSKELETAL: Normal range of motion. No edema noted.  Labs and Imaging US Transvaginal Non-ob  09/08/2015  CLINICAL DATA:  Menorrhagia  with regular cycles. IUD. LMP 08/06/2015. EXAM: TRANSABDOMINAL AND TRANSVAGINAL ULTRASOUND OF PELVIS TECHNIQUE: Both transabdominal and transvaginal ultrasound examinations of the pelvis were performed. Transabdominal technique was performed for global imaging of the pelvis including uterus, ovaries, adnexal regions, and pelvic cul-de-sac. It was necessary to proceed with endovaginal exam following the transabdominal exam to visualize the endometrium and ovaries. COMPARISON:  None FINDINGS: Uterus Measurements: 15.5 x 11.7 x 10.6 cm. Multiple fibroids are seen which involve the uterus diffusely. The 3 largest fibroids measure 5.1 cm, 5.5 cm, and 6.7 cm in maximum diameter. Endometrium Thickness: Not well visualized due to acoustic shadowing from multiple fibroids. IUD is noted within the endometrial cavity. Right ovary Measurements: 4.0 x 2.5 x 2.1 cm transabdominally. Normal appearance/no adnexal mass. Left ovary Measurements: 3.6 x 2.9 x 1.9 cm transvaginally. Normal appearance/no adnexal mass. Other findings No free fluid. IMPRESSION: Multiple uterine fibroids, largest measuring 6.7 cm. IUD visualized within endometrial cavity. Normal appearance of both ovaries.  No adnexal mass identified. Electronically Signed   By: Earle Gell M.D.   On: 09/08/2015 11:18   US Pelvis Complete  09/08/2015  CLINICAL DATA:  Menorrhagia with regular cycles. IUD. LMP 08/06/2015. EXAM: TRANSABDOMINAL AND TRANSVAGINAL ULTRASOUND OF PELVIS TECHNIQUE: Both transabdominal and transvaginal ultrasound examinations of the pelvis were performed. Transabdominal technique was performed for global imaging of the pelvis including uterus, ovaries, adnexal regions, and pelvic cul-de-sac. It was necessary to proceed with endovaginal exam following the transabdominal exam to visualize the endometrium and ovaries. COMPARISON:  None FINDINGS: Uterus Measurements: 15.5 x 11.7 x 10.6 cm. Multiple fibroids are seen which involve the uterus diffusely.  The 3 largest fibroids measure 5.1  cm, 5.5 cm, and 6.7 cm in maximum diameter. Endometrium Thickness: Not well visualized due to acoustic shadowing from multiple fibroids. IUD is noted within the endometrial cavity. Right ovary Measurements: 4.0 x 2.5 x 2.1 cm transabdominally. Normal appearance/no adnexal mass. Left ovary Measurements: 3.6 x 2.9 x 1.9 cm transvaginally. Normal appearance/no adnexal mass. Other findings No free fluid. IMPRESSION: Multiple uterine fibroids, largest measuring 6.7 cm. IUD visualized within endometrial cavity. Normal appearance of both ovaries.  No adnexal mass identified. Electronically Signed   By: Earle Gell M.D.   On: 09/08/2015 11:18    Results for orders placed or performed in visit on 09/03/15 (from the past 504 hour(s))  Cytology - PAP   Collection Time: 09/03/15 12:00 AM  Result Value Ref Range   CYTOLOGY - PAP PAP RESULT   CBC   Collection Time: 09/03/15 10:38 AM  Result Value Ref Range   WBC 5.1 4.0 - 10.5 K/uL   RBC 5.31 (H) 3.87 - 5.11 MIL/uL   Hemoglobin 10.1 (L) 12.0 - 15.0 g/dL   HCT 34.4 (L) 36.0 - 46.0 %   MCV 64.8 (L) 78.0 - 100.0 fL   MCH 19.0 (L) 26.0 - 34.0 pg   MCHC 29.4 (L) 30.0 - 36.0 g/dL   RDW 22.6 (H) 11.5 - 15.5 %   Platelets 375 150 - 400 K/uL   MPV Not Performed 8.6 - 12.4 fL  TSH   Collection Time: 09/03/15 10:38 AM  Result Value Ref Range   TSH 1.022 0.350 - 4.500 uIU/mL     Assessment & Plan:  1. Uterine leiomyoma, unspecified location 2. Menorrhagia with regular cycle Discussed ultrasound and lab results.  Patient is G0, desires future childbearing, does not want surgery for now, has no pain, just wants to treat menorrhagia.  Discussed management options for menorrhagia including tranexamic acid (Lysteda), oral progesterone (Megace), Depo Provera, Mirena IUD.  Discussed risks and benefits of each method in detail, all questions answered.   Patient wants to avoid hormonal treatment,  desires Lysteda and this was  prescribed.  Bleeding precautions reviewed.  - tranexamic acid (LYSTEDA) 650 MG TABS tablet; Take 2 tablets (1,300 mg total) by mouth 3 (three) times daily. Take during period for a maximum of five days  Dispense: 30 tablet; Refill: 5 - ibuprofen (ADVIL,MOTRIN) 800 MG tablet; Take 1 tablet (800 mg total) by mouth 3 (three) times daily with meals as needed for headache or moderate pain (menorrhagia).  Dispense: 30 tablet; Refill: 5 Routine preventative health maintenance measures emphasized. Please refer to After Visit Summary for other counseling recommendations.  Return if symptoms worsen or fail to improve.   Total face-to-face time with patient: 25 minutes. Over 50% of encounter was spent on counseling and coordination of care.   Verita Schneiders, MD, [place] Attending Obstetrician & Gynecologist, [place] for [facility]

## 2015-11-04 MED FILL — TRANEXAMIC ACID 650 MG TAB: 650 | 5 days supply | Qty: 30 | Fill #1

## 2015-12-09 MED FILL — IBUPROFEN 800 MG TABLET: 800 | 10 days supply | Qty: 30 | Fill #1

## 2015-12-09 MED FILL — TRANEXAMIC ACID 650 MG TAB: 650 | 5 days supply | Qty: 30 | Fill #2

## 2016-01-01 MED FILL — TRANEXAMIC ACID 650 MG TAB: 650 | 5 days supply | Qty: 30 | Fill #3

## 2016-01-11 DIAGNOSIS — H5213 Myopia, bilateral: Secondary | ICD-10-CM | POA: Diagnosis not present

## 2016-03-16 MED FILL — TRANEXAMIC ACID 650 MG TAB: 650 | 5 days supply | Qty: 30 | Fill #4

## 2016-04-15 MED FILL — TRANEXAMIC ACID 650 MG TAB: 650 | 5 days supply | Qty: 30 | Fill #5

## 2016-06-09 ENCOUNTER — Encounter: Payer: Self-pay | Admitting: Obstetrics & Gynecology

## 2016-06-09 DIAGNOSIS — N920 Excessive and frequent menstruation with regular cycle: Secondary | ICD-10-CM

## 2016-06-15 MED ORDER — TRANEXAMIC ACID 650 MG PO TABS: 1300.0000 mg | ORAL_TABLET | Freq: Three times a day (TID) | ORAL | 3 refills | Status: DC

## 2016-06-15 MED FILL — TRANEXAMIC ACID 650 MG TAB: 650 | 5 days supply | Qty: 30 | Fill #0

## 2016-06-15 NOTE — Telephone Encounter (Signed)
Lysteda refilled per Dr Elly Modena order, pt last office visit was 09-16-15.  Informed pt that medication was sent to pharmacy and she may pick up at her convenience.

## 2016-06-15 NOTE — Telephone Encounter (Signed)
-----   Message from Francia Greaves sent at 06/15/2016 10:20 AM EDT ----- Regarding: Refill Request Contact: [phone] Wants a refill on [facility]

## 2016-07-08 MED FILL — IBUPROFEN 800 MG TABLET: 800 | 10 days supply | Qty: 30 | Fill #2

## 2016-08-09 MED FILL — TRANEXAMIC ACID 650 MG TAB: 650 | 5 days supply | Qty: 30 | Fill #1

## 2016-09-05 ENCOUNTER — Ambulatory Visit (INDEPENDENT_AMBULATORY_CARE_PROVIDER_SITE_OTHER): Payer: 59 | Admitting: Obstetrics & Gynecology

## 2016-09-05 ENCOUNTER — Encounter: Payer: Self-pay | Admitting: Obstetrics & Gynecology

## 2016-09-05 VITALS — BP 127/86 | HR 98 | Ht 67.0 in | Wt 156.0 lb

## 2016-09-05 DIAGNOSIS — Z1151 Encounter for screening for human papillomavirus (HPV): Secondary | ICD-10-CM

## 2016-09-05 DIAGNOSIS — Z124 Encounter for screening for malignant neoplasm of cervix: Secondary | ICD-10-CM | POA: Diagnosis not present

## 2016-09-05 DIAGNOSIS — N920 Excessive and frequent menstruation with regular cycle: Secondary | ICD-10-CM

## 2016-09-05 DIAGNOSIS — Z01419 Encounter for gynecological examination (general) (routine) without abnormal findings: Secondary | ICD-10-CM | POA: Diagnosis not present

## 2016-09-05 LAB — LIPID PANEL
CHOLESTEROL: 154 mg/dL (ref <200)
HDL: 69 mg/dL (ref >50)
LDL Cholesterol: 77 mg/dL (ref <100)
TRIGLYCERIDES: 40 mg/dL (ref <150)
Total CHOL/HDL Ratio: 2.2 Ratio (ref <5.0)
VLDL: 8 mg/dL (ref <30)

## 2016-09-05 LAB — COMPREHENSIVE METABOLIC PANEL
ALBUMIN: 4.4 g/dL (ref 3.6–5.1)
ALT: 10 U/L (ref 6–29)
AST: 19 U/L (ref 10–30)
Alkaline Phosphatase: 88 U/L (ref 33–115)
BILIRUBIN TOTAL: 0.3 mg/dL (ref 0.2–1.2)
BUN: 8 mg/dL (ref 7–25)
CALCIUM: 9.3 mg/dL (ref 8.6–10.2)
CHLORIDE: 104 mmol/L (ref 98–110)
CO2: 25 mmol/L (ref 20–31)
Creat: 0.71 mg/dL (ref 0.50–1.10)
Glucose, Bld: 68 mg/dL (ref 65–99)
Potassium: 3.6 mmol/L (ref 3.5–5.3)
Sodium: 138 mmol/L (ref 135–146)
TOTAL PROTEIN: 8.4 g/dL — AB (ref 6.1–8.1)

## 2016-09-05 MED ORDER — MISOPROSTOL 200 MCG PO TABS: ORAL_TABLET | ORAL | 0 refills | Status: DC

## 2016-09-05 MED ORDER — TRANEXAMIC ACID 650 MG PO TABS: 1300.0000 mg | ORAL_TABLET | Freq: Three times a day (TID) | ORAL | 6 refills | Status: DC

## 2016-09-05 MED FILL — TRANEXAMIC ACID 650 MG TAB: 650 | 5 days supply | Qty: 30 | Fill #0

## 2016-09-05 NOTE — Progress Notes (Signed)
Subjective:    Alicia Larson is a 30 y.o. S AA G63  female who presents for an annual exam. The patient has no complaints today. Happy with Lysteda. The patient is sexually active. GYN screening history: last pap: was normal. The patient wears seatbelts: yes. The patient participates in regular exercise: yes. Has the patient ever been transfused or tattooed?: no. The patient reports that there is not domestic violence in her life.   Menstrual History: OB History    Gravida Para Term Preterm AB Living   0 0 0 0 0     SAB TAB Ectopic Multiple Live Births   0 0 0          Menarche age:65 Patient's last menstrual period was 08/14/2016.    The following portions of the patient's history were reviewed and updated as appropriate: allergies, current medications, past family history, past medical history, past social history, past surgical history and problem list.  Review of Systems Pertinent items are noted in HPI.   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   Objective:    BP 127/86   Pulse 98   Ht 5\' 7"  (1.702 m)   Wt 156 lb (70.8 kg)   LMP 08/14/2016   BMI 24.43 kg/m   General Appearance:    Alert, cooperative, no distress, appears stated age  Head:    Normocephalic, without obvious abnormality, atraumatic  Eyes:    PERRL, conjunctiva/corneas clear, EOM's intact, fundi    benign, both eyes  Ears:    Normal TM's and external ear canals, both ears  Nose:   Nares normal, septum midline, mucosa normal, no drainage    or sinus tenderness  Throat:   Lips, mucosa, and tongue normal; teeth and gums normal  Neck:   Supple, symmetrical, trachea midline, no adenopathy;    thyroid:  no enlargement/tenderness/nodules; no carotid   bruit or JVD  Back:     Symmetric, no curvature, ROM normal, no CVA tenderness  Lungs:     Clear to auscultation bilaterally, respirations unlabored  Chest Wall:    No tenderness or deformity   Heart:    Regular rate and rhythm, S1 and S2  normal, no murmur, rub   or gallop  Breast Exam:    No tenderness, masses, or nipple abnormality  Abdomen:     Soft, non-tender, bowel sounds active all four quadrants,    no masses, no organomegaly  Genitalia:    Normal female without lesion, discharge or tenderness, U-2 with fibroids, No adnexal masses     Extremities:   Extremities normal, atraumatic, no cyanosis or edema  Pulses:   2+ and symmetric all extremities  Skin:   Skin color, texture, turgor normal, no rashes or lesions  Lymph nodes:   Cervical, supraclavicular, and axillary nodes normal  Neurologic:   CNII-XII intact, normal strength, sensation and reflexes    throughout   .    Assessment:    Healthy female exam.    Plan:     Thin prep pap with cotesting (ACOG recs discussed)   She may change to Mirena prn to help with heavy periods from fibroids (I will prescribe cytotec)

## 2016-09-06 LAB — CBC
HEMATOCRIT: 35.3 % (ref 35.0–45.0)
HEMOGLOBIN: 10 g/dL — AB (ref 11.7–15.5)
MCH: 18.3 pg — AB (ref 27.0–33.0)
MCHC: 28.3 g/dL — AB (ref 32.0–36.0)
MCV: 64.7 fL — ABNORMAL LOW (ref 80.0–100.0)
Platelets: 335 10*3/uL (ref 140–400)
RBC: 5.46 MIL/uL — ABNORMAL HIGH (ref 3.80–5.10)
RDW: 18.5 % — ABNORMAL HIGH (ref 11.0–15.0)
WBC: 5.2 10*3/uL (ref 3.8–10.8)

## 2016-09-06 LAB — VITAMIN D 25 HYDROXY (VIT D DEFICIENCY, FRACTURES): VIT D 25 HYDROXY: 15 ng/mL — AB (ref 30–100)

## 2016-09-06 LAB — CYTOLOGY - PAP
Diagnosis: NEGATIVE
HPV: NOT DETECTED

## 2016-09-06 LAB — TSH: TSH: 0.49 m[IU]/L

## 2016-09-12 ENCOUNTER — Telehealth: Payer: Self-pay | Admitting: *Deleted

## 2016-09-12 DIAGNOSIS — R7989 Other specified abnormal findings of blood chemistry: Secondary | ICD-10-CM

## 2016-09-12 MED ORDER — VITAMIN D (ERGOCALCIFEROL) 1.25 MG (50000 UNIT) PO CAPS: 50000.0000 [IU] | ORAL_CAPSULE | ORAL | 0 refills | Status: DC

## 2016-09-12 MED FILL — VIT D2 1.25 MG (50,000 UNIT: 1.25 MG | 84 days supply | Qty: 12 | Fill #0

## 2016-09-12 NOTE — Telephone Encounter (Signed)
-----   Message from Gretchen Short, [place] sent at 09/12/2016  8:37 AM EST ----- Cyril Mourning,  Can you call this patient for Dr. Hulan Fray since I am working [employer] this morning?  Dr. Hulan Fray,  I have been traveling to other offices so it might be more efficient for you to send messages to the clinical pool at [facility],  which is  [facility] [place]  Thank you, Leafy Ro ----- Message ----- From: Emily Filbert, MD Sent: 09/08/2016   9:42 AM To: Gretchen Short, CMA  She will need 12 weeks of weekly Vitamin D 50,000 units and then a recheck of her level. Thanks

## 2016-09-12 NOTE — Telephone Encounter (Signed)
error 

## 2016-09-12 NOTE — Telephone Encounter (Signed)
-----   Message from Gretchen Short, [place] sent at 09/12/2016  8:37 AM EST ----- Cyril Mourning,  Can you call this patient for Dr. Hulan Fray since I am working [employer] this morning?  Dr. Hulan Fray,  I have been traveling to other offices so it might be more efficient for you to send messages to the clinical pool at [facility],  which is  [facility] [place]  Thank you, Leafy Ro ----- Message ----- From: Emily Filbert, MD Sent: 09/08/2016   9:42 AM To: Gretchen Short, CMA  She will need 12 weeks of weekly Vitamin D 50,000 units and then a recheck of her level. Thanks

## 2016-09-12 NOTE — Telephone Encounter (Signed)
Informed pt of low Vitamin D level and need for medication treatment.  Sent rx to pharmacy and instructed pt to call the office and schedule Vit D level when she gets close to the end of her treatment.  Pt acknowledged instructions.

## 2016-09-12 NOTE — Telephone Encounter (Signed)
Called pt, no answer, left message to call the office.  

## 2016-10-14 MED FILL — TRANEXAMIC ACID 650 MG TAB: 650 | 5 days supply | Qty: 30 | Fill #1

## 2017-02-14 MED FILL — TRANEXAMIC ACID 650 MG TAB: 650 | 5 days supply | Qty: 30 | Fill #2

## 2017-02-23 DIAGNOSIS — H5213 Myopia, bilateral: Secondary | ICD-10-CM | POA: Diagnosis not present

## 2017-03-23 MED FILL — TRANEXAMIC ACID 650 MG TAB: 650 | 5 days supply | Qty: 30 | Fill #3

## 2017-06-01 MED FILL — TRANEXAMIC ACID 650 MG TAB: 650 | 5 days supply | Qty: 30 | Fill #4

## 2017-08-28 MED FILL — TRANEXAMIC ACID 650 MG TAB: 650 | 5 days supply | Qty: 30 | Fill #5

## 2018-01-24 ENCOUNTER — Ambulatory Visit: Payer: 59 | Admitting: Family Medicine

## 2018-01-24 ENCOUNTER — Encounter: Payer: Self-pay | Admitting: Family Medicine

## 2018-01-24 VITALS — BP 136/84 | HR 95 | Temp 98.5°F | Resp 14 | Ht 68.0 in | Wt 161.2 lb

## 2018-01-24 DIAGNOSIS — Z0000 Encounter for general adult medical examination without abnormal findings: Secondary | ICD-10-CM | POA: Diagnosis not present

## 2018-01-24 DIAGNOSIS — E559 Vitamin D deficiency, unspecified: Secondary | ICD-10-CM

## 2018-01-24 DIAGNOSIS — D509 Iron deficiency anemia, unspecified: Secondary | ICD-10-CM | POA: Diagnosis not present

## 2018-01-24 NOTE — Patient Instructions (Addendum)
Try to limit saturated fats in your diet (bologna, hot dogs, barbeque, cheeseburgers, hamburgers, steak, bacon, sausage, cheese, etc.) and get more fresh fruits, vegetables, and whole grains Try to follow the DASH guidelines (DASH stands for Dietary Approaches to Stop Hypertension). Try to limit the sodium in your diet to no more than 1,'500mg'$  of sodium per day. Certainly try to not exceed 2,000 mg per day at the very most. Do not add salt when cooking or at the table.  Check the sodium amount on labels when shopping, and choose items lower in sodium when given a choice. Avoid or limit foods that already contain a lot of sodium. Eat a diet rich in fruits and vegetables and whole grains, and try to lose weight if overweight or obese  Health Maintenance, Female Adopting a healthy lifestyle and getting preventive care can go a long way to promote health and wellness. Talk with your health care provider about what schedule of regular examinations is right for you. This is a good chance for you to check in with your provider about disease prevention and staying healthy. In between checkups, there are plenty of things you can do on your own. Experts have done a lot of research about which lifestyle changes and preventive measures are most likely to keep you healthy. Ask your health care provider for more information. Weight and diet Eat a healthy diet  Be sure to include plenty of vegetables, fruits, low-fat dairy products, and lean protein.  Do not eat a lot of foods high in solid fats, added sugars, or salt.  Get regular exercise. This is one of the most important things you can do for your health. ? Most adults should exercise for at least 150 minutes each week. The exercise should increase your heart rate and make you sweat (moderate-intensity exercise). ? Most adults should also do strengthening exercises at least twice a week. This is in addition to the moderate-intensity exercise.  Maintain a  healthy weight  Body mass index (BMI) is a measurement that can be used to identify possible weight problems. It estimates body fat based on height and weight. Your health care provider can help determine your BMI and help you achieve or maintain a healthy weight.  For females 92 years of age and older: ? A BMI below 18.5 is considered underweight. ? A BMI of 18.5 to 24.9 is normal. ? A BMI of 25 to 29.9 is considered overweight. ? A BMI of 30 and above is considered obese.  Watch levels of cholesterol and blood lipids  You should start having your blood tested for lipids and cholesterol at 34 years of age, then have this test every 5 years.  You may need to have your cholesterol levels checked more often if: ? Your lipid or cholesterol levels are high. ? You are older than 34 years of age. ? You are at high risk for heart disease.  Cancer screening Lung Cancer  Lung cancer screening is recommended for adults 39-40 years old who are at high risk for lung cancer because of a history of smoking.  A yearly low-dose CT scan of the lungs is recommended for people who: ? Currently smoke. ? Have quit within the past 15 years. ? Have at least a 30-pack-year history of smoking. A pack year is smoking an average of one pack of cigarettes a day for 1 year.  Yearly screening should continue until it has been 15 years since you quit.  Yearly screening should  stop if you develop a health problem that would prevent you from having lung cancer treatment.  Breast Cancer  Practice breast self-awareness. This means understanding how your breasts normally appear and feel.  It also means doing regular breast self-exams. Let your health care provider know about any changes, no matter how small.  If you are in your 20s or 30s, you should have a clinical breast exam (CBE) by a health care provider every 1-3 years as part of a regular health exam.  If you are 53 or older, have a CBE every year. Also  consider having a breast X-ray (mammogram) every year.  If you have a family history of breast cancer, talk to your health care provider about genetic screening.  If you are at high risk for breast cancer, talk to your health care provider about having an MRI and a mammogram every year.  Breast cancer gene (BRCA) assessment is recommended for women who have family members with BRCA-related cancers. BRCA-related cancers include: ? Breast. ? Ovarian. ? Tubal. ? Peritoneal cancers.  Results of the assessment will determine the need for genetic counseling and BRCA1 and BRCA2 testing.  Cervical Cancer Your health care provider may recommend that you be screened regularly for cancer of the pelvic organs (ovaries, uterus, and vagina). This screening involves a pelvic examination, including checking for microscopic changes to the surface of your cervix (Pap test). You may be encouraged to have this screening done every 3 years, beginning at age 51.  For women ages 52-65, health care providers may recommend pelvic exams and Pap testing every 3 years, or they may recommend the Pap and pelvic exam, combined with testing for human papilloma virus (HPV), every 5 years. Some types of HPV increase your risk of cervical cancer. Testing for HPV may also be done on women of any age with unclear Pap test results.  Other health care providers may not recommend any screening for nonpregnant women who are considered low risk for pelvic cancer and who do not have symptoms. Ask your health care provider if a screening pelvic exam is right for you.  If you have had past treatment for cervical cancer or a condition that could lead to cancer, you need Pap tests and screening for cancer for at least 20 years after your treatment. If Pap tests have been discontinued, your risk factors (such as having a new sexual partner) need to be reassessed to determine if screening should resume. Some women have medical problems that  increase the chance of getting cervical cancer. In these cases, your health care provider may recommend more frequent screening and Pap tests.  Colorectal Cancer  This type of cancer can be detected and often prevented.  Routine colorectal cancer screening usually begins at 34 years of age and continues through 34 years of age.  Your health care provider may recommend screening at an earlier age if you have risk factors for colon cancer.  Your health care provider may also recommend using home test kits to check for hidden blood in the stool.  A small camera at the end of a tube can be used to examine your colon directly (sigmoidoscopy or colonoscopy). This is done to check for the earliest forms of colorectal cancer.  Routine screening usually begins at age 40.  Direct examination of the colon should be repeated every 5-10 years through 34 years of age. However, you may need to be screened more often if early forms of precancerous polyps or small  growths are found.  Skin Cancer  Check your skin from head to toe regularly.  Tell your health care provider about any new moles or changes in moles, especially if there is a change in a mole's shape or color.  Also tell your health care provider if you have a mole that is larger than the size of a pencil eraser.  Always use sunscreen. Apply sunscreen liberally and repeatedly throughout the day.  Protect yourself by wearing long sleeves, pants, a wide-brimmed hat, and sunglasses whenever you are outside.  Heart disease, diabetes, and high blood pressure  High blood pressure causes heart disease and increases the risk of stroke. High blood pressure is more likely to develop in: ? People who have blood pressure in the high end of the normal range (130-139/85-89 mm Hg). ? People who are overweight or obese. ? People who are African American.  If you are 63-60 years of age, have your blood pressure checked every 3-5 years. If you are 5  years of age or older, have your blood pressure checked every year. You should have your blood pressure measured twice-once when you are at a hospital or clinic, and once when you are not at a hospital or clinic. Record the average of the two measurements. To check your blood pressure when you are not at a hospital or clinic, you can use: ? An automated blood pressure machine at a pharmacy. ? A home blood pressure monitor.  If you are between 27 years and 66 years old, ask your health care provider if you should take aspirin to prevent strokes.  Have regular diabetes screenings. This involves taking a blood sample to check your fasting blood sugar level. ? If you are at a normal weight and have a low risk for diabetes, have this test once every three years after 34 years of age. ? If you are overweight and have a high risk for diabetes, consider being tested at a younger age or more often. Preventing infection Hepatitis B  If you have a higher risk for hepatitis B, you should be screened for this virus. You are considered at high risk for hepatitis B if: ? You were born in a country where hepatitis B is common. Ask your health care provider which countries are considered high risk. ? Your parents were born in a high-risk country, and you have not been immunized against hepatitis B (hepatitis B vaccine). ? You have HIV or AIDS. ? You use needles to inject street drugs. ? You live with someone who has hepatitis B. ? You have had sex with someone who has hepatitis B. ? You get hemodialysis treatment. ? You take certain medicines for conditions, including cancer, organ transplantation, and autoimmune conditions.  Hepatitis C  Blood testing is recommended for: ? Everyone born from [birth year] through 1967. ? Anyone with known risk factors for hepatitis C.  Sexually transmitted infections (STIs)  You should be screened for sexually transmitted infections (STIs) including gonorrhea and chlamydia  if: ? You are sexually active and are younger than 34 years of age. ? You are older than 34 years of age and your health care provider tells you that you are at risk for this type of infection. ? Your sexual activity has changed since you were last screened and you are at an increased risk for chlamydia or gonorrhea. Ask your health care provider if you are at risk.  If you do not have HIV, but are at risk, it may  be recommended that you take a prescription medicine daily to prevent HIV infection. This is called pre-exposure prophylaxis (PrEP). You are considered at risk if: ? You are sexually active and do not regularly use condoms or know the HIV status of your partner(s). ? You take drugs by injection. ? You are sexually active with a partner who has HIV.  Talk with your health care provider about whether you are at high risk of being infected with HIV. If you choose to begin PrEP, you should first be tested for HIV. You should then be tested every 3 months for as long as you are taking PrEP. Pregnancy  If you are premenopausal and you may become pregnant, ask your health care provider about preconception counseling.  If you may become pregnant, take 400 to 800 micrograms (mcg) of folic acid every day.  If you want to prevent pregnancy, talk to your health care provider about birth control (contraception). Osteoporosis and menopause  Osteoporosis is a disease in which the bones lose minerals and strength with aging. This can result in serious bone fractures. Your risk for osteoporosis can be identified using a bone density scan.  If you are 98 years of age or older, or if you are at risk for osteoporosis and fractures, ask your health care provider if you should be screened.  Ask your health care provider whether you should take a calcium or vitamin D supplement to lower your risk for osteoporosis.  Menopause may have certain physical symptoms and risks.  Hormone replacement therapy  may reduce some of these symptoms and risks. Talk to your health care provider about whether hormone replacement therapy is right for you. Follow these instructions at home:  Schedule regular health, dental, and eye exams.  Stay current with your immunizations.  Do not use any tobacco products including cigarettes, chewing tobacco, or electronic cigarettes.  If you are pregnant, do not drink alcohol.  If you are breastfeeding, limit how much and how often you drink alcohol.  Limit alcohol intake to no more than 1 drink per day for nonpregnant women. One drink equals 12 ounces of beer, 5 ounces of wine, or 1 ounces of hard liquor.  Do not use street drugs.  Do not share needles.  Ask your health care provider for help if you need support or information about quitting drugs.  Tell your health care provider if you often feel depressed.  Tell your health care provider if you have ever been abused or do not feel safe at home. This information is not intended to replace advice given to you by your health care provider. Make sure you discuss any questions you have with your health care provider. Document Released: 04/25/2011 Document Revised: 03/17/2016 Document Reviewed: 07/14/2015 Elsevier Interactive Patient Education  [employer].

## 2018-01-24 NOTE — Progress Notes (Signed)
BP 136/84   Pulse 95   Temp 98.5 F (36.9 C) (Oral)   Resp 14   Ht 5' 8" (1.727 m)   Wt 161 lb 3.2 oz (73.1 kg)   LMP 01/18/2018   SpO2 98%   BMI 24.51 kg/m    Subjective:    Patient ID: Alicia Larson, female    DOB: 1986/05/03, 32 y.o.   MRN: [id number]  HPI: Alicia Larson is a 32 y.o. female  Chief Complaint  Patient presents with  . New Patient (Initial Visit)  . Establish Care    HPI Here to establish care She has not had labs in over a year Hx of anemia; wants to get her levels checked; kind of run down after her cycle; tired at times; does eat meat; hx of vitamin D; no dry mouth or blurred vision Not taking extra iron; does eat some iron-rich foods Going to work on diet Tetanus due and will get at work [employer] flu shot at work; no flu this winter  fam hx of cholesterol USPSTF grade A and B recommendations Depression:  Depression screen [facility] 2/9 01/24/2018  Decreased Interest 0  Down, Depressed, Hopeless 0  PHQ - 2 Score 0   Hypertension: BP Readings from Last 3 Encounters:  01/24/18 136/84  09/05/16 127/86  09/16/15 135/87   Obesity: Wt Readings from Last 3 Encounters:  01/24/18 161 lb 3.2 oz (73.1 kg)  09/05/16 156 lb (70.8 kg)  09/16/15 151 lb (68.5 kg)   BMI Readings from Last 3 Encounters:  01/24/18 24.51 kg/m  09/05/16 24.43 kg/m  09/16/15 23.65 kg/m    Skin cancer: no worrisome moles Lung cancer:  N/a; nonsmoker Breast cancer: through GYN; no lumps Colorectal cancer: no colon cancer Cervical cancer screening: through GYN BRCA gene screening: family hx of breast and/or ovarian cancer and/or metastatic prostate cancer? no HIV, hep B, hep C: not interested STD testing and prevention (chl/gon/syphilis): not intersted Intimate partner violence: no abuse Contraception: IUD, happy with that, due to be changed  Fall prevention/vitamin D: discussed Immunizations: discussed Diet: room for improvement Exercise: room for  improvement Alcohol: occasional Tobacco use: nonsmoker AAA: n/a Aspirin:n/a Glucose: check today Glucose, Bld  Date Value Ref Range Status  01/24/2018 78 65 - 99 mg/dL Final    Comment:    .            Fasting reference interval .   09/05/2016 68 65 - 99 mg/dL Final  03/29/2012 83 70 - 99 mg/dL Final   Lipids:  Lab Results  Component Value Date   CHOL 160 01/24/2018   CHOL 154 09/05/2016   CHOL 135 03/29/2012   Lab Results  Component Value Date   HDL 60 01/24/2018   HDL 69 09/05/2016   HDL 52 03/29/2012   Lab Results  Component Value Date   LDLCALC 89 01/24/2018   LDLCALC 77 09/05/2016   LDLCALC 77 03/29/2012   Lab Results  Component Value Date   TRIG 36 01/24/2018   TRIG 40 09/05/2016   TRIG 28 03/29/2012   Lab Results  Component Value Date   CHOLHDL 2.7 01/24/2018   CHOLHDL 2.2 09/05/2016   CHOLHDL 2.6 03/29/2012   No results found for: LDLDIRECT   Depression screen PHQ 2/9 01/24/2018  Decreased Interest 0  Down, Depressed, Hopeless 0  PHQ - 2 Score 0    Relevant past medical, surgical, family and social history reviewed Past Medical History:  Diagnosis Date  . Anemia   .  Fibroids 09/16/2015  . Menorrhagia 09/03/2015   Past Surgical History:  Procedure Laterality Date  . WISDOM TOOTH EXTRACTION  2008   Family History  Problem Relation Age of Onset  . Diabetes Mother   . Hypertension Mother   . Hypertension Maternal Grandmother   . Hyperlipidemia Maternal Grandfather   . Hypertension Maternal Grandfather   . Heart disease Maternal Grandfather    Social History   Tobacco Use  . Smoking status: Never Smoker  . Smokeless tobacco: Never Used  Substance Use Topics  . Alcohol use: Yes    Alcohol/week: 0.0 oz    Comment: ocassionally  . Drug use: No    Interim medical history since last visit reviewed. Allergies and medications reviewed  Review of Systems Per HPI unless specifically indicated above     Objective:    BP 136/84    Pulse 95   Temp 98.5 F (36.9 C) (Oral)   Resp 14   Ht 5' 8" (1.727 m)   Wt 161 lb 3.2 oz (73.1 kg)   LMP 01/18/2018   SpO2 98%   BMI 24.51 kg/m   Wt Readings from Last 3 Encounters:  01/24/18 161 lb 3.2 oz (73.1 kg)  09/05/16 156 lb (70.8 kg)  09/16/15 151 lb (68.5 kg)    Physical Exam  Constitutional: She appears well-developed and well-nourished.  HENT:  Head: Normocephalic and atraumatic.  Right Ear: Hearing, tympanic membrane, external ear and ear canal normal.  Left Ear: Hearing, tympanic membrane, external ear and ear canal normal.  Eyes: Conjunctivae and EOM are normal. Right eye exhibits no hordeolum. Left eye exhibits no hordeolum. No scleral icterus.  Neck: Carotid bruit is not present. No thyromegaly present.  Cardiovascular: Normal rate, regular rhythm, S1 normal, S2 normal and normal heart sounds.  No extrasystoles are present.  Pulmonary/Chest: Effort normal and breath sounds normal. No respiratory distress.  Abdominal: Soft. Normal appearance and bowel sounds are normal. She exhibits no distension, no abdominal bruit, no pulsatile midline mass and no mass. There is no hepatosplenomegaly. There is no tenderness. No hernia.  Musculoskeletal: Normal range of motion. She exhibits no edema.  Lymphadenopathy:       Head (right side): No submandibular adenopathy present.       Head (left side): No submandibular adenopathy present.    She has no cervical adenopathy.  Neurological: She is alert. She displays no tremor. No cranial nerve deficit. She exhibits normal muscle tone. Gait normal.  Reflex Scores:      Patellar reflexes are 2+ on the right side and 2+ on the left side. Skin: Skin is warm and dry. No bruising and no ecchymosis noted. No cyanosis. No pallor.  Psychiatric: Her speech is normal and behavior is normal. Thought content normal. Her mood appears not anxious. She does not exhibit a depressed mood.        Assessment & Plan:   Problem List Items  Addressed This Visit    None    Visit Diagnoses    Preventative health care    -  Primary   Relevant Orders   CBC with Differential/Platelet (Completed)   COMPLETE METABOLIC PANEL WITH GFR (Completed)   Lipid panel (Completed)   TSH (Completed)   Vitamin D deficiency       Relevant Orders   VITAMIN D 25 Hydroxy (Vit-D Deficiency, Fractures) (Completed)   Iron deficiency anemia, unspecified iron deficiency anemia type       Relevant Orders   Fe+TIBC+Fer (Completed)  Follow up plan: Return in about 1 year (around 01/25/2019) for complete physical.  An after-visit summary was printed and given to the patient at [place].  Please see the patient instructions which may contain other information and recommendations beyond what is mentioned above in the assessment and plan.  No orders of the defined types were placed in this encounter.   Orders Placed This Encounter  Procedures  . CBC with Differential/Platelet  . COMPLETE METABOLIC PANEL WITH GFR  . Lipid panel  . TSH  . VITAMIN D 25 Hydroxy (Vit-D Deficiency, Fractures)  . Fe+TIBC+Fer  . PLATELET ESTIMATION  . CBC MORPHOLOGY

## 2018-01-25 ENCOUNTER — Other Ambulatory Visit: Payer: Self-pay | Admitting: Family Medicine

## 2018-01-25 DIAGNOSIS — R779 Abnormality of plasma protein, unspecified: Secondary | ICD-10-CM | POA: Insufficient documentation

## 2018-01-25 DIAGNOSIS — D509 Iron deficiency anemia, unspecified: Secondary | ICD-10-CM | POA: Insufficient documentation

## 2018-01-25 DIAGNOSIS — E8809 Other disorders of plasma-protein metabolism, not elsewhere classified: Secondary | ICD-10-CM

## 2018-01-25 HISTORY — DX: Abnormality of plasma protein, unspecified: R77.9

## 2018-01-25 LAB — CBC WITH DIFFERENTIAL/PLATELET
Basophils Absolute: 28 cells/uL (ref 0–200)
Basophils Relative: 0.8 %
EOS ABS: 39 {cells}/uL (ref 15–500)
Eosinophils Relative: 1.1 %
HEMATOCRIT: 31 % — AB (ref 35.0–45.0)
Hemoglobin: 8.7 g/dL — ABNORMAL LOW (ref 11.7–15.5)
LYMPHS ABS: 1082 {cells}/uL (ref 850–3900)
MCH: 18.1 pg — AB (ref 27.0–33.0)
MCHC: 28.1 g/dL — ABNORMAL LOW (ref 32.0–36.0)
MCV: 64.4 fL — AB (ref 80.0–100.0)
MPV: 10.1 fL (ref 7.5–12.5)
Monocytes Relative: 9.1 %
Neutro Abs: 2034 cells/uL (ref 1500–7800)
Neutrophils Relative %: 58.1 %
PLATELETS: 489 10*3/uL — AB (ref 140–400)
RBC: 4.81 10*6/uL (ref 3.80–5.10)
RDW: 16.6 % — AB (ref 11.0–15.0)
Total Lymphocyte: 30.9 %
WBC: 3.5 10*3/uL — ABNORMAL LOW (ref 3.8–10.8)
WBCMIX: 319 {cells}/uL (ref 200–950)

## 2018-01-25 LAB — COMPLETE METABOLIC PANEL WITH GFR
AG RATIO: 1 (calc) (ref 1.0–2.5)
ALT: 12 U/L (ref 6–29)
AST: 19 U/L (ref 10–30)
Albumin: 4.4 g/dL (ref 3.6–5.1)
Alkaline phosphatase (APISO): 101 U/L (ref 33–115)
BILIRUBIN TOTAL: 0.4 mg/dL (ref 0.2–1.2)
BUN: 8 mg/dL (ref 7–25)
CALCIUM: 9.6 mg/dL (ref 8.6–10.2)
CHLORIDE: 105 mmol/L (ref 98–110)
CO2: 28 mmol/L (ref 20–32)
Creat: 0.77 mg/dL (ref 0.50–1.10)
GFR, Est African American: 118 mL/min/{1.73_m2} (ref >=60)
GFR, Est Non African American: 101 mL/min/{1.73_m2} (ref >=60)
GLOBULIN: 4.2 g/dL — AB (ref 1.9–3.7)
Glucose, Bld: 78 mg/dL (ref 65–99)
POTASSIUM: 4 mmol/L (ref 3.5–5.3)
SODIUM: 139 mmol/L (ref 135–146)
TOTAL PROTEIN: 8.6 g/dL — AB (ref 6.1–8.1)

## 2018-01-25 LAB — LIPID PANEL
Cholesterol: 160 mg/dL (ref <200)
HDL: 60 mg/dL (ref >50)
LDL Cholesterol (Calc): 89 mg/dL (calc)
Non-HDL Cholesterol (Calc): 100 mg/dL (calc) (ref <130)
Total CHOL/HDL Ratio: 2.7 (calc) (ref <5.0)
Triglycerides: 36 mg/dL (ref <150)

## 2018-01-25 LAB — TSH: TSH: 1.16 mIU/L

## 2018-01-25 LAB — IRON,TIBC AND FERRITIN PANEL
%SAT: 2 % (calc) — ABNORMAL LOW (ref 11–50)
FERRITIN: 3 ng/mL — AB (ref 10–154)
Iron: 12 ug/dL — ABNORMAL LOW (ref 40–190)
TIBC: 502 mcg/dL (calc) — ABNORMAL HIGH (ref 250–450)

## 2018-01-25 LAB — VITAMIN D 25 HYDROXY (VIT D DEFICIENCY, FRACTURES): VIT D 25 HYDROXY: 10 ng/mL — AB (ref 30–100)

## 2018-01-25 LAB — CBC MORPHOLOGY

## 2018-01-25 NOTE — Assessment & Plan Note (Signed)
Asked pt to pick up stool cards; refer to GI to look for occult GI bleeding source; start iron; recheck labs in 4 weeks; if no improvement, send to heme

## 2018-01-25 NOTE — Assessment & Plan Note (Signed)
First elevation Nov 2017 with outside doctor; recheck in 4 weeks

## 2018-01-25 NOTE — Progress Notes (Signed)
Refer to GI; start iron; start vit D Rx; lab orders

## 2018-02-05 ENCOUNTER — Encounter: Payer: Self-pay | Admitting: Family Medicine

## 2018-02-05 DIAGNOSIS — R7989 Other specified abnormal findings of blood chemistry: Secondary | ICD-10-CM

## 2018-02-05 MED ORDER — VITAMIN D (ERGOCALCIFEROL) 1.25 MG (50000 UNIT) PO CAPS: 50000.0000 [IU] | ORAL_CAPSULE | ORAL | 1 refills | Status: DC

## 2018-02-15 MED FILL — VIT D2 1.25 MG (50,000 UNIT: 1.25 MG | 28 days supply | Qty: 4 | Fill #0

## 2018-02-27 ENCOUNTER — Ambulatory Visit: Payer: 59 | Admitting: Gastroenterology

## 2018-03-08 ENCOUNTER — Telehealth: Payer: 59 | Admitting: Physician Assistant

## 2018-03-08 DIAGNOSIS — B9789 Other viral agents as the cause of diseases classified elsewhere: Secondary | ICD-10-CM

## 2018-03-08 DIAGNOSIS — J019 Acute sinusitis, unspecified: Secondary | ICD-10-CM | POA: Diagnosis not present

## 2018-03-08 MED ORDER — FLUTICASONE PROPIONATE 50 MCG/ACT NA SUSP: 2.0000 | Freq: Every day | NASAL | 0 refills | Status: DC

## 2018-03-08 NOTE — Progress Notes (Signed)

## 2018-03-11 ENCOUNTER — Ambulatory Visit (INDEPENDENT_AMBULATORY_CARE_PROVIDER_SITE_OTHER): Payer: Self-pay | Admitting: Family Medicine

## 2018-03-11 VITALS — BP 120/88 | HR 100 | Temp 99.0°F | Resp 16 | Wt 158.2 lb

## 2018-03-11 DIAGNOSIS — K047 Periapical abscess without sinus: Secondary | ICD-10-CM

## 2018-03-11 MED ORDER — AMOXICILLIN-POT CLAVULANATE 875-125 MG PO TABS: 1.0000 | ORAL_TABLET | Freq: Two times a day (BID) | ORAL | 0 refills | Status: DC

## 2018-03-11 NOTE — Patient Instructions (Signed)
Dental Abscess A dental abscess is pus in or around a tooth. Follow these instructions at home:  Take medicines only as told by your dentist.  If you were prescribed antibiotic medicine, finish all of it even if you start to feel better.  Rinse your mouth (gargle) often with salt water.  Do not drive or use heavy machinery, like a lawn mower, while taking pain medicine.  Do not apply heat to the outside of your mouth.  Keep all follow-up visits as told by your dentist. This is important. Contact a doctor if:  Your pain is worse, and medicine does not help. Get help right away if:  You have a fever or chills.  Your symptoms suddenly get worse.  You have a very bad headache.  You have problems breathing or swallowing.  You have trouble opening your mouth.  You have puffiness (swelling) in your neck or around your eye. This information is not intended to replace advice given to you by your health care provider. Make sure you discuss any questions you have with your health care provider. Document Released: 02/24/2015 Document Revised: 03/17/2016 Document Reviewed: 10/07/2014 Elsevier Interactive Patient Education  2018 Else[employer] Inc[employer]  

## 2018-03-11 NOTE — Progress Notes (Signed)
Alicia Larson is a 32 y.o. female who presents today with concerns of sinus pressure and tooth pain. She has an upcoming dental appointment on 03/29/2018 she reports. She has run a low grade temp for a few days per report and is using Motrin for pain/discomfort.  Review of Systems  Constitutional: Positive for chills, fever and malaise/fatigue. Negative for weight loss.  HENT: Positive for congestion and sinus pain. Negative for ear discharge, ear pain and sore throat.   Eyes: Negative.   Respiratory: Negative for cough, sputum production and shortness of breath.   Cardiovascular: Negative.  Negative for chest pain.  Gastrointestinal: Negative for abdominal pain, diarrhea, nausea and vomiting.  Genitourinary: Negative for dysuria, frequency, hematuria and urgency.  Musculoskeletal: Negative for myalgias.  Skin: Negative.   Neurological: Negative for headaches.  Endo/Heme/Allergies: Negative.   Psychiatric/Behavioral: Negative.     O: Vitals:   03/11/18 1144  BP: 120/88  Pulse: 100  Resp: 16  Temp: 99 F (37.2 C)  SpO2: 99%     Physical Exam  Constitutional: She is oriented to person, place, and time. Vital signs are normal. She appears well-developed and well-nourished. She is active.  Non-toxic appearance. She does not have a sickly appearance.  HENT:  Head: Normocephalic.  Right Ear: Hearing, tympanic membrane, external ear and ear canal normal.  Left Ear: Hearing, tympanic membrane, external ear and ear canal normal.  Nose: Nose normal.  Mouth/Throat: Uvula is midline and oropharynx is clear and moist.    1 cm x 1 cm pustule to right upper tooth  Neck: Normal range of motion. Neck supple.  Cardiovascular: Normal rate, regular rhythm, normal heart sounds and normal pulses.  Pulmonary/Chest: Effort normal and breath sounds normal.  Abdominal: Soft. Bowel sounds are normal.  Musculoskeletal: Normal range of motion.  Lymphadenopathy:       Head (right side): No  submental and no submandibular adenopathy present.       Head (left side): No submental and no submandibular adenopathy present.    She has no cervical adenopathy.  Neurological: She is alert and oriented to person, place, and time.  Psychiatric: She has a normal mood and affect.  Vitals reviewed.   A: 1. Tooth abscess     P: Advised to ensure f/u with dentist. Exam findings, diagnosis etiology and medication use and indications reviewed with patient. Follow- Up and discharge instructions provided. No emergent/urgent issues found on exam.  Patient verbalized understanding of information provided and agrees with plan of care (POC), all questions answered.  1. Tooth abscess - amoxicillin-clavulanate (AUGMENTIN) 875-125 MG tablet; Take 1 tablet by mouth 2 (two) times daily.

## 2018-03-14 ENCOUNTER — Telehealth: Payer: Self-pay

## 2018-03-14 NOTE — Telephone Encounter (Signed)
I left a message to the patient asking to call us back. 

## 2018-05-09 DIAGNOSIS — H5213 Myopia, bilateral: Secondary | ICD-10-CM | POA: Diagnosis not present

## 2018-05-16 ENCOUNTER — Ambulatory Visit (INDEPENDENT_AMBULATORY_CARE_PROVIDER_SITE_OTHER): Payer: 59 | Admitting: Obstetrics & Gynecology

## 2018-05-16 ENCOUNTER — Encounter: Payer: Self-pay | Admitting: Obstetrics & Gynecology

## 2018-05-16 VITALS — BP 150/87 | HR 72 | Wt 158.0 lb

## 2018-05-16 DIAGNOSIS — Z23 Encounter for immunization: Secondary | ICD-10-CM | POA: Diagnosis not present

## 2018-05-16 DIAGNOSIS — T8332XA Displacement of intrauterine contraceptive device, initial encounter: Secondary | ICD-10-CM

## 2018-05-16 DIAGNOSIS — Z30432 Encounter for removal of intrauterine contraceptive device: Secondary | ICD-10-CM | POA: Diagnosis not present

## 2018-05-16 DIAGNOSIS — D219 Benign neoplasm of connective and other soft tissue, unspecified: Secondary | ICD-10-CM | POA: Diagnosis not present

## 2018-05-16 NOTE — Progress Notes (Signed)
   Subjective:    Patient ID: Alicia Larson, female    DOB: [birth year], 32 y.o.   MRN: [id number]  HPI 32 yo single AA G0   Review of Systems     Objective:   Physical Exam Breathing, conversing, and ambulating normally Well nourished, well hydrated Black female, no apparent distress IUD strings not seen UPT negative. I prepped the cervix and used an IUD hook to try to find the IUD I did not have success I did a bedside u/s but with so many calcified fibroids, I was not able to definitively see the IUD       Assessment & Plan:  Preventative care- TDAP today Expired Paragard and lost strings- schedule gyn u/s If IUD in uterus, then refer over to [facility] office for hysteroscopic removal

## 2018-05-16 NOTE — Patient Instructions (Signed)
Tdap Vaccine (Tetanus, Diphtheria and Pertussis): What You Need to Know 1. Why get vaccinated? Tetanus, diphtheria and pertussis are very serious diseases. Tdap vaccine can protect us from these diseases. And, Tdap vaccine given to pregnant women can protect newborn babies against pertussis. TETANUS (Lockjaw) is rare in the [place] today. It causes painful muscle tightening and stiffness, usually all over the body.  It can lead to tightening of muscles in the head and neck so you can't open your mouth, swallow, or sometimes even breathe. Tetanus kills about 1 out of 10 people who are infected even after receiving the best medical care.  DIPHTHERIA is also rare in the [place] today. It can cause a thick coating to form in the back of the throat.  It can lead to breathing problems, heart failure, paralysis, and death.  PERTUSSIS (Whooping Cough) causes severe coughing spells, which can cause difficulty breathing, vomiting and disturbed sleep.  It can also lead to weight loss, incontinence, and rib fractures. Up to 2 in 100 adolescents and 5 in 100 adults with pertussis are hospitalized or have complications, which could include pneumonia or death.  These diseases are caused by bacteria. Diphtheria and pertussis are spread from person to person through secretions from coughing or sneezing. Tetanus enters the body through cuts, scratches, or wounds. Before vaccines, as many as 200,000 cases of diphtheria, 200,000 cases of pertussis, and hundreds of cases of tetanus, were reported in the [place] each year. Since vaccination began, reports of cases for tetanus and diphtheria have dropped by about 99% and for pertussis by about 80%. 2. Tdap vaccine Tdap vaccine can protect adolescents and adults from tetanus, diphtheria, and pertussis. One dose of Tdap is routinely given at age 11 or 12. People who did not get Tdap at that age should get it as soon as possible. Tdap is especially  important for healthcare professionals and anyone having close contact with a baby younger than 12 months. Pregnant women should get a dose of Tdap during every pregnancy, to protect the newborn from pertussis. Infants are most at risk for severe, life-threatening complications from pertussis. Another vaccine, called Td, protects against tetanus and diphtheria, but not pertussis. A Td booster should be given every 10 years. Tdap may be given as one of these boosters if you have never gotten Tdap before. Tdap may also be given after a severe cut or burn to prevent tetanus infection. Your doctor or the person giving you the vaccine can give you more information. Tdap may safely be given at the same time as other vaccines. 3. Some people should not get this vaccine  A person who has ever had a life-threatening allergic reaction after a previous dose of any diphtheria, tetanus or pertussis containing vaccine, OR has a severe allergy to any part of this vaccine, should not get Tdap vaccine. Tell the person giving the vaccine about any severe allergies.  Anyone who had coma or long repeated seizures within 7 days after a childhood dose of DTP or DTaP, or a previous dose of Tdap, should not get Tdap, unless a cause other than the vaccine was found. They can still get Td.  Talk to your doctor if you: ? have seizures or another nervous system problem, ? had severe pain or swelling after any vaccine containing diphtheria, tetanus or pertussis, ? ever had a condition called Guillain-Barr Syndrome (GBS), ? aren't feeling well on the day the shot is scheduled. 4. Risks With any medicine, including   vaccines, there is a chance of side effects. These are usually mild and go away on their own. Serious reactions are also possible but are rare. Most people who get Tdap vaccine do not have any problems with it. Mild problems following Tdap: (Did not interfere with activities)  Pain where the shot was given (about  3 in 4 adolescents or 2 in 3 adults)  Redness or swelling where the shot was given (about 1 person in 5)  Mild fever of at least 100.4F (up to about 1 in 25 adolescents or 1 in 100 adults)  Headache (about 3 or 4 people in 10)  Tiredness (about 1 person in 3 or 4)  Nausea, vomiting, diarrhea, stomach ache (up to 1 in 4 adolescents or 1 in 10 adults)  Chills, sore joints (about 1 person in 10)  Body aches (about 1 person in 3 or 4)  Rash, swollen glands (uncommon)  Moderate problems following Tdap: (Interfered with activities, but did not require medical attention)  Pain where the shot was given (up to 1 in 5 or 6)  Redness or swelling where the shot was given (up to about 1 in 16 adolescents or 1 in 12 adults)  Fever over 102F (about 1 in 100 adolescents or 1 in 250 adults)  Headache (about 1 in 7 adolescents or 1 in 10 adults)  Nausea, vomiting, diarrhea, stomach ache (up to 1 or 3 people in 100)  Swelling of the entire arm where the shot was given (up to about 1 in 500).  Severe problems following Tdap: (Unable to perform usual activities; required medical attention)  Swelling, severe pain, bleeding and redness in the arm where the shot was given (rare).  Problems that could happen after any vaccine:  People sometimes faint after a medical procedure, including vaccination. Sitting or lying down for about 15 minutes can help prevent fainting, and injuries caused by a fall. Tell your doctor if you feel dizzy, or have vision changes or ringing in the ears.  Some people get severe pain in the shoulder and have difficulty moving the arm where a shot was given. This happens very rarely.  Any medication can cause a severe allergic reaction. Such reactions from a vaccine are very rare, estimated at fewer than 1 in a million doses, and would happen within a few minutes to a few hours after the vaccination. As with any medicine, there is a very remote chance of a vaccine  causing a serious injury or death. The safety of vaccines is always being monitored. For more information, visit: www.cdc.gov/vaccinesafety/ 5. What if there is a serious problem? What should I look for? Look for anything that concerns you, such as signs of a severe allergic reaction, very high fever, or unusual behavior. Signs of a severe allergic reaction can include hives, swelling of the face and throat, difficulty breathing, a fast heartbeat, dizziness, and weakness. These would usually start a few minutes to a few hours after the vaccination. What should I do?  If you think it is a severe allergic reaction or other emergency that can't wait, call 9-1-1 or get the person to the nearest hospital. Otherwise, call your doctor.  Afterward, the reaction should be reported to the Vaccine Adverse Event Reporting System (VAERS). Your doctor might file this report, or you can do it yourself through the VAERS web site at www.vaers.hhs.gov, or by calling 1-800-822-7967. ? VAERS does not give medical advice. 6. The National Vaccine Injury Compensation Program The National   Vaccine Injury Compensation Program (VICP) is a federal program that was created to compensate people who may have been injured by certain vaccines. Persons who believe they may have been injured by a vaccine can learn about the program and about filing a claim by calling 1-800-338-2382 or visiting the VICP website at www.hrsa.gov/vaccinecompensation. There is a time limit to file a claim for compensation. 7. How can I learn more?  Ask your doctor. He or she can give you the vaccine package insert or suggest other sources of information.  Call your local or state health department.  Contact the Centers for Disease Control and Prevention (CDC): ? Call 1-800-232-4636 (1-800-CDC-INFO) or ? Visit CDC's website at www.cdc.gov/vaccines CDC Tdap Vaccine VIS (12/17/13) This information is not intended to replace advice given to you by your  health care provider. Make sure you discuss any questions you have with your health care provider. Document Released: 04/10/2012 Document Revised: 06/30/2016 Document Reviewed: 06/30/2016 Elsevier Interactive Patient Education  2017 [employer] Inc.  

## 2018-05-22 ENCOUNTER — Ambulatory Visit (HOSPITAL_COMMUNITY)
Admission: RE | Admit: 2018-05-22 | Discharge: 2018-05-22 | Disposition: A | Discharge status: Home / Self Care | Payer: 59 | Source: Ambulatory Visit | Attending: Obstetrics & Gynecology | Admitting: Obstetrics & Gynecology

## 2018-05-22 ENCOUNTER — Other Ambulatory Visit: Payer: Self-pay | Admitting: Obstetrics & Gynecology

## 2018-05-22 DIAGNOSIS — D219 Benign neoplasm of connective and other soft tissue, unspecified: Secondary | ICD-10-CM | POA: Diagnosis present

## 2018-05-22 DIAGNOSIS — D259 Leiomyoma of uterus, unspecified: Secondary | ICD-10-CM | POA: Diagnosis not present

## 2018-05-22 DIAGNOSIS — T8332XA Displacement of intrauterine contraceptive device, initial encounter: Secondary | ICD-10-CM

## 2018-05-22 DIAGNOSIS — X58XXXA Exposure to other specified factors, initial encounter: Secondary | ICD-10-CM | POA: Insufficient documentation

## 2018-08-17 ENCOUNTER — Other Ambulatory Visit: Payer: Self-pay

## 2018-08-17 MED ORDER — MISOPROSTOL 200 MCG PO TABS: ORAL_TABLET | ORAL | 0 refills | Status: DC

## 2018-08-20 ENCOUNTER — Ambulatory Visit (INDEPENDENT_AMBULATORY_CARE_PROVIDER_SITE_OTHER): Payer: 59 | Admitting: Obstetrics & Gynecology

## 2018-08-20 ENCOUNTER — Encounter: Payer: Self-pay | Admitting: Obstetrics & Gynecology

## 2018-08-20 VITALS — BP 138/81 | HR 90 | Resp 16 | Ht 67.0 in | Wt 157.0 lb

## 2018-08-20 DIAGNOSIS — T8332XD Displacement of intrauterine contraceptive device, subsequent encounter: Secondary | ICD-10-CM | POA: Diagnosis not present

## 2018-08-20 DIAGNOSIS — Z30433 Encounter for removal and reinsertion of intrauterine contraceptive device: Secondary | ICD-10-CM | POA: Diagnosis not present

## 2018-08-20 DIAGNOSIS — Z3202 Encounter for pregnancy test, result negative: Secondary | ICD-10-CM | POA: Diagnosis not present

## 2018-08-20 DIAGNOSIS — D219 Benign neoplasm of connective and other soft tissue, unspecified: Secondary | ICD-10-CM

## 2018-08-20 DIAGNOSIS — Z01818 Encounter for other preprocedural examination: Secondary | ICD-10-CM

## 2018-08-20 LAB — POCT URINE PREGNANCY: PREG TEST UR: NEGATIVE

## 2018-08-20 MED ORDER — LEVONORGESTREL 20 MCG/24HR IU IUD
INTRAUTERINE_SYSTEM | Freq: Once | INTRAUTERINE | Status: AC
  Administered 2018-08-20: 1 via INTRAUTERINE

## 2018-08-20 MED ORDER — KETOROLAC TROMETHAMINE 30 MG/ML IJ SOLN
30.0000 mg | Freq: Once | INTRAMUSCULAR | Status: AC
  Administered 2018-08-20: 30 mg via INTRAMUSCULAR

## 2018-08-20 NOTE — Patient Instructions (Signed)
Uterine Fibroids Uterine fibroids are tissue masses (tumors) that can develop in the womb (uterus). They are also called leiomyomas. This type of tumor is not cancerous (benign) and does not spread to other parts of the body outside of the pelvic area, which is between the hip bones. Occasionally, fibroids may develop in the fallopian tubes, in the cervix, or on the support structures (ligaments) that surround the uterus. You can have one or many fibroids. Fibroids can vary in size, weight, and where they grow in the uterus. Some can become quite large. Most fibroids do not require medical treatment. What are the causes? A fibroid can develop when a single uterine cell keeps growing (replicating). Most cells in the human body have a control mechanism that keeps them from replicating without control. What are the signs or symptoms? Symptoms may include:  Heavy bleeding during your period.  Bleeding or spotting between periods.  Pelvic pain and pressure.  Bladder problems, such as needing to urinate more often (urinary frequency) or urgently.  Inability to reproduce offspring (infertility).  Miscarriages.  How is this diagnosed? Uterine fibroids are diagnosed through a physical exam. Your health care provider may feel the lumpy tumors during a pelvic exam. Ultrasonography and an MRI may be done to determine the size, location, and number of fibroids. How is this treated? Treatment may include:  Watchful waiting. This involves getting the fibroid checked by your health care provider to see if it grows or shrinks. Follow your health care provider's recommendations for how often to have this checked.  Hormone medicines. These can be taken by mouth or given through an intrauterine device (IUD).  Surgery. ? Removing the fibroids (myomectomy) or the uterus (hysterectomy). ? Removing blood supply to the fibroids (uterine artery embolization).  If fibroids interfere with your fertility and you  want to become pregnant, your health care provider may recommend having the fibroids removed. Follow these instructions at home:  Keep all follow-up visits as directed by your health care provider. This is important.  Take over-the-counter and prescription medicines only as told by your health care provider. ? If you were prescribed a hormone treatment, take the hormone medicines exactly as directed.  Ask your health care provider about taking iron pills and increasing the amount of dark green, leafy vegetables in your diet. These actions can help to boost your blood iron levels, which may be affected by heavy menstrual bleeding.  Pay close attention to your period and tell your health care provider about any changes, such as: ? Increased blood flow that requires you to use more pads or tampons than usual per month. ? A change in the number of days that your period lasts per month. ? A change in symptoms that are associated with your period, such as abdominal cramping or back pain. Contact a health care provider if:  You have pelvic pain, back pain, or abdominal cramps that cannot be controlled with medicines.  You have an increase in bleeding between and during periods.  You soak tampons or pads in a half hour or less.  You feel lightheaded, extra tired, or weak. Get help right away if:  You faint.  You have a sudden increase in pelvic pain. This information is not intended to replace advice given to you by your health care provider. Make sure you discuss any questions you have with your health care provider. Document Released: 10/07/2000 Document Revised: 06/09/2016 Document Reviewed: 04/08/2014 Elsevier Interactive Patient Education  [employer]. Hysteroscopy,  Care After Refer to this sheet in the next few weeks. These instructions provide you with information on caring for yourself after your procedure. Your health care provider may also give you more specific instructions.  Your treatment has been planned according to current medical practices, but problems sometimes occur. Call your health care provider if you have any problems or questions after your procedure. What can I expect after the procedure? After your procedure, it is typical to have the following:  You may have some cramping. This normally lasts for a couple days.  You may have bleeding. This can vary from light spotting for a few days to menstrual-like bleeding for 3-7 days.  Follow these instructions at home:  Rest for the first 1-2 days after the procedure.  Only take over-the-counter or prescription medicines as directed by your health care provider. Do not take aspirin. It can increase the chances of bleeding.  Take showers instead of baths for 2 weeks or as directed by your health care provider.  Do not drive for 24 hours or as directed.  Do not drink alcohol while taking pain medicine.  Do not use tampons, douche, or have sexual intercourse for 2 weeks or until your health care provider says it is okay.  Take your temperature twice a day for 4-5 days. Write it down each time.  Follow your health care provider's advice about diet, exercise, and lifting.  If you develop constipation, you may: ? Take a mild laxative if your health care provider approves. ? Add bran foods to your diet. ? Drink enough fluids to keep your urine clear or pale yellow.  Try to have someone with you or available to you for the first 24-48 hours, especially if you were given a general anesthetic.  Follow up with your health care provider as directed. Contact a health care provider if:  You feel dizzy or lightheaded.  You feel sick to your stomach (nauseous).  You have abnormal vaginal discharge.  You have a rash.  You have pain that is not controlled with medicine. Get help right away if:  You have bleeding that is heavier than a normal menstrual period.  You have a fever.  You have increasing  cramps or pain, not controlled with medicine.  You have new belly (abdominal) pain.  You pass out.  You have pain in the tops of your shoulders (shoulder strap areas).  You have shortness of breath. This information is not intended to replace advice given to you by your health care provider. Make sure you discuss any questions you have with your health care provider. Document Released: 07/31/2013 Document Revised: 03/17/2016 Document Reviewed: 05/09/2013 Elsevier Interactive Patient Education  2017 [place]. Hysteroscopy Hysteroscopy is a procedure used for looking inside the womb (uterus). It may be done for various reasons, including:  To evaluate abnormal bleeding, fibroid (benign, noncancerous) tumors, polyps, scar tissue (adhesions), and possibly cancer of the uterus.  To look for lumps (tumors) and other uterine growths.  To look for causes of why a woman cannot get pregnant (infertility), causes of recurrent loss of pregnancy (miscarriages), or a lost intrauterine device (IUD).  To perform a sterilization by blocking the fallopian tubes from inside the uterus.  In this procedure, a thin, flexible tube with a tiny light and camera on the end of it (hysteroscope) is used to look inside the uterus. A hysteroscopy should be done right after a menstrual period to be sure you are not pregnant. LET YOUR  HEALTH CARE PROVIDER KNOW ABOUT:  Any allergies you have.  All medicines you are taking, including vitamins, herbs, eye drops, creams, and over-the-counter medicines.  Previous problems you or members of your family have had with the use of anesthetics.  Any blood disorders you have.  Previous surgeries you have had.  Medical conditions you have. RISKS AND COMPLICATIONS Generally, this is a safe procedure. However, as with any procedure, complications can occur. Possible complications include:  Putting a hole in the uterus.  Excessive bleeding.  Infection.  Damage to  the cervix.  Injury to other organs.  Allergic reaction to medicines.  Too much fluid used in the uterus for the procedure.  BEFORE THE PROCEDURE  Ask your health care provider about changing or stopping any regular medicines.  Do not take aspirin or blood thinners for 1 week before the procedure, or as directed by your health care provider. These can cause bleeding.  If you smoke, do not smoke for 2 weeks before the procedure.  In some cases, a medicine is placed in the cervix the day before the procedure. This medicine makes the cervix have a larger opening (dilate). This makes it easier for the instrument to be inserted into the uterus during the procedure.  Do not eat or drink anything for at least 8 hours before the surgery.  Arrange for someone to take you home after the procedure. PROCEDURE  You may be given a medicine to relax you (sedative). You may also be given one of the following: ? A medicine that numbs the area around the cervix (local anesthetic). ? A medicine that makes you sleep through the procedure (general anesthetic).  The hysteroscope is inserted through the vagina into the uterus. The camera on the hysteroscope sends a picture to a TV screen. This gives the surgeon a good view inside the uterus.  During the procedure, air or a liquid is put into the uterus, which allows the surgeon to see better.  Sometimes, tissue is gently scraped from inside the uterus. These tissue samples are sent to a lab for testing. What to expect after the procedure  If you had a general anesthetic, you may be groggy for a couple hours after the procedure.  If you had a local anesthetic, you will be able to go home as soon as you are stable and feel ready.  You may have some cramping. This normally lasts for a couple days.  You may have bleeding, which varies from light spotting for a few days to menstrual-like bleeding for 3-7 days. This is normal.  If your test results are  not back during the visit, make an appointment with your health care provider to find out the results. This information is not intended to replace advice given to you by your health care provider. Make sure you discuss any questions you have with your health care provider. Document Released: 01/16/2001 Document Revised: 03/17/2016 Document Reviewed: 05/09/2013 Elsevier Interactive Patient Education  2017 [employer].

## 2018-08-20 NOTE — Progress Notes (Signed)
INDICATIONS: 32 y.o. G0P0000  here for scheduled surgery for .   Risks of surgery were discussed with the patient including but not limited to: bleeding which may require transfusion; infection which may require antibiotics; injury to uterus or surrounding organs; intrauterine scarring which may impair future fertility; need for additional procedures including laparotomy or laparoscopy; and other postoperative/anesthesia complications. Written informed consent was obtained.    FINDINGS:  A 20 week size uterus.  Diffuse proliferative endometrium.  Normal ostia bilaterally.  ANESTHESIA:   General, paracervical block. 2% Lidocaine with epi   FLUID DEFICITS:  267ml of Normal saline ESTIMATED BLOOD LOSS:  Less than 20 ml SPECIMENS: IUD COMPLICATIONS:  None immediate.  PROCEDURE DETAILS:  The patient was taken to the procedure room where she received Toradol 10 mg IM. She also took cytotec 420mcg ~ hours prior to the visit. Patient identified, informed consent performed.  Discussed risks of irregular bleeding, cramping, infection, malpositioning or misplacement of the IUD outside the uterus which may require further procedures. Time out was performed.  Urine pregnancy test negative. After an adequate timeout was performed, she was placed in the dorsal lithotomy position and examined; then prepped and draped in the sterile manner.   A speculum was then placed in the patient's vagina.  A paracervical block of 20cc of 2% lidocaine with epinephrine was placed with 5cc at both 5 adn 7 o'clock.  A single tooth tenaculum was applied to the anterior lip of the cervix.   A 60mm hysteroscope was inserted under direct visualization using normal saline as a distending medium.  The uterine cavity was carefully examined, both ostia were recognized, and diffusely proliferative endometrium was noted.  The IUD strings were noted in the endometrial cavity.  There were no masses noted.  Uterus sounded to 13 cm.  Mirena IUD placed  per manufacturer's recommendations.  Strings trimmed to 3 cm. The tenaculum was removed from the anterior lip of the cervix and the vaginal speculum was removed after noting good hemostasis.  A bimanual exam was performed and the uterus was 20cm.  The patient tolerated the procedure well.   There were no immediate complications.  Patient was given post-procedure instructions.  Patient was asked to follow up in 4 weeks for IUD check.  I reviewed with pt fibroids and the treatment options for her fibroids and also the natural history of fibroids.      Ashly Yepez L. Harraway-Smith, M.D., Cherlynn June

## 2018-09-24 ENCOUNTER — Ambulatory Visit: Payer: 59 | Admitting: Obstetrics & Gynecology

## 2018-10-15 ENCOUNTER — Encounter: Payer: Self-pay | Admitting: Obstetrics & Gynecology

## 2018-10-15 ENCOUNTER — Ambulatory Visit (INDEPENDENT_AMBULATORY_CARE_PROVIDER_SITE_OTHER): Payer: 59 | Admitting: Obstetrics & Gynecology

## 2018-10-15 VITALS — BP 134/88 | HR 86 | Wt 158.0 lb

## 2018-10-15 DIAGNOSIS — Z30431 Encounter for routine checking of intrauterine contraceptive device: Secondary | ICD-10-CM

## 2018-10-15 NOTE — Patient Instructions (Signed)

## 2018-10-15 NOTE — Progress Notes (Signed)
Patient reports some spotting on various days since insertion of IUD. Kathrene Alu RN

## 2018-10-15 NOTE — Progress Notes (Signed)
History:  32 y.o. G0P0000 here today for today for IUD string check; Mirena IUD was placed  08/20/2018. No complaints about the Mirena, no concerning side effects.  The following portions of the patient's history were reviewed and updated as appropriate: allergies, current medications, past family history, past medical history, past social history, past surgical history and problem list. Last pap smear on 09/05/2016 was normal, neg HRHPV.  Review of Systems:  Pertinent items are noted in HPI.   Objective:  Physical Exam There were no vitals taken for this visit. Gen: NAD Abd: Soft, nontender and nondistended Pelvic: Normal appearing external genitalia; normal appearing vaginal mucosa and cervix.  IUD strings visualized, about 3 cm in length outside cervix.   Assessment & Plan:  Normal IUD check. Patient to keep IUD in place for five years; can come in for removal if she desires pregnancy within the next five years. Routine preventative health maintenance measures emphasized.  Xerxes Agrusa L. Harraway-Smith, M.D., Cherlynn June

## 2018-10-31 ENCOUNTER — Telehealth: Payer: Self-pay | Admitting: Family Medicine

## 2018-10-31 NOTE — Telephone Encounter (Signed)
I'm going through old orders that have not been completed Patient was supposed to have labs done in May 2019 I don't see any results She was also supposed to return stool cards and see the GI doctor I don't see that those things have happened either  Please contact her, ask her to make an appt to see me asap to evaluate her, recheck labs

## 2018-10-31 NOTE — Telephone Encounter (Signed)
Pt is scheduled for 11/08/2018.

## 2018-11-08 ENCOUNTER — Ambulatory Visit: Payer: 59 | Admitting: Family Medicine

## 2018-12-12 MED FILL — HYDROCODON-APAP 5-325: 5-325 | 3 days supply | Qty: 20 | Fill #0

## 2018-12-12 MED FILL — CLINDAMYCIN HCL 150 MG CAPS: 150 | 7 days supply | Qty: 28 | Fill #0

## 2019-05-08 ENCOUNTER — Encounter: Payer: Self-pay | Admitting: Radiology

## 2019-05-21 DIAGNOSIS — H5213 Myopia, bilateral: Secondary | ICD-10-CM | POA: Diagnosis not present

## 2019-06-26 ENCOUNTER — Ambulatory Visit (INDEPENDENT_AMBULATORY_CARE_PROVIDER_SITE_OTHER): Payer: 59 | Admitting: Family Medicine

## 2019-06-26 ENCOUNTER — Other Ambulatory Visit: Payer: Self-pay

## 2019-06-26 ENCOUNTER — Encounter: Payer: Self-pay | Admitting: Family Medicine

## 2019-06-26 VITALS — BP 132/84 | HR 80 | Temp 97.1°F | Resp 14 | Ht 67.0 in | Wt 159.9 lb

## 2019-06-26 DIAGNOSIS — D509 Iron deficiency anemia, unspecified: Secondary | ICD-10-CM

## 2019-06-26 DIAGNOSIS — E559 Vitamin D deficiency, unspecified: Secondary | ICD-10-CM

## 2019-06-26 DIAGNOSIS — D219 Benign neoplasm of connective and other soft tissue, unspecified: Secondary | ICD-10-CM

## 2019-06-26 DIAGNOSIS — Z1239 Encounter for other screening for malignant neoplasm of breast: Secondary | ICD-10-CM

## 2019-06-26 DIAGNOSIS — Z13228 Encounter for screening for other metabolic disorders: Secondary | ICD-10-CM

## 2019-06-26 DIAGNOSIS — Z130 Encounter for screening for diseases of the blood and blood-forming organs and certain disorders involving the immune mechanism: Secondary | ICD-10-CM

## 2019-06-26 DIAGNOSIS — Z1329 Encounter for screening for other suspected endocrine disorder: Secondary | ICD-10-CM

## 2019-06-26 DIAGNOSIS — Z008 Encounter for other general examination: Secondary | ICD-10-CM | POA: Diagnosis not present

## 2019-06-26 DIAGNOSIS — Z1321 Encounter for screening for nutritional disorder: Secondary | ICD-10-CM

## 2019-06-26 DIAGNOSIS — Z13220 Encounter for screening for lipoid disorders: Secondary | ICD-10-CM

## 2019-06-26 NOTE — Progress Notes (Signed)
Patient: Alicia Larson, Female    DOB: 09/15/1986, 33 y.o.   MRN: [id number] Delsa Grana, PA-C Visit Date: 06/26/2019  Today's Provider: Delsa Grana, PA-C   Chief Complaint  Patient presents with  . Annual Exam    See GYN for Pap   Subjective:   Annual physical exam:  Alicia Larson is a 33 y.o. female who presents today for health maintenance and annual & complete physical exam.   Exercise/Activity:  Tries to walk couple times a week, but admits she's not very consistent  Diet/nutrition:  Has some supplements, avoids fatty fried foods, incorporating more vegetables, overall healthy  She reports she is sleeping well.  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  Anemia -  Due to low iron and chronic blood loss.  Her labs have been as low as Hgb of 5, but no past blood transfusions, no family hx of anemia, bleeding disorders, coagulopathies, and pt denies any known hx of thalassemia.  Has never been to hematology before.  No other bleeding sx.  She was instructed to do hemocult before to r/out GI blood loss but she never came to get cards from office/pcp.  She denies melena, hematochezia.  She does feel cold all the time and sometimes has fast HR and SOB.     Vit D deficiency supplementing for the past month OTC, has been consistently low with all labs done for the past couple years.  Last was 10 01/2018.   USPSTF grade A and B recommendations - reviewed and addressed today  Depression:  Phq 9 completed today by patient, was reviewed by me with patient in the room, score is 0; negative, pt feels good.  Depression screen [facility] 2/9 06/26/2019 01/24/2018  Decreased Interest 0 0  Down, Depressed, Hopeless 0 0  PHQ - 2 Score 0 0  Altered sleeping 0 -  Tired,  decreased energy 0 -  Change in appetite 0 -  Feeling bad or failure about yourself  0 -  Trouble concentrating 0 -  Moving slowly or fidgety/restless 0 -  Suicidal thoughts 0 -  PHQ-9 Score 0 -    Hep C Screening: Not indicated STD testing and prevention (HIV/chl/gon/syphilis):  Long term partner, no need for STD screening, declines HIV Intimate partner violence:  none Sexual History/Pain during Intercourse: no pain, denies Menstrual History/LMP/Abnormal Bleeding:   Heavy bleeding IUD put in last year sees OBGYN (see above) Patient's last menstrual period was 05/28/2019 (exact date).  Breast cancer:  No past mammogram BRCA gene screening: none - no family hx Cervical cancer screening: UTD with OBGYN Family hx of cancers - breast, ovarian, uterine, colon - none, pt denies  Osteoporosis:   None/ Not indicated for age Pt is supplementing with Vit D.  Skin cancer:  Skin CA hx YES/NO, not done for a while, had tinea versicolor (2013) Last skin survey:2013  No concerning skin lesions/moles  Colorectal cancer:   colonoscopy is N/A  Lung cancer:    Low Dose CT Chest recommended if Age 46-80 years, 30 pack-year currently smoking OR have quit w/in 15years. Patient does not qualify.   Social History   Tobacco Use  . Smoking status: Never Smoker  . Smokeless tobacco: Never Used  Substance Use Topics  . Alcohol use: Yes    Alcohol/week: 1.0 standard drinks    Types: 1 Shots of liquor per week    Comment: ocassionally (3-4 x a month)     Alcohol screening:   Office Visit from 06/26/2019 in [facility]  AUDIT-C Score  1      ECG:  None in the past  Blood pressure/Hypertension:  BP Readings from Last 3 Encounters:  06/26/19 132/84  10/15/18 134/88  08/20/18 138/81   Weight/Obesity: Wt Readings from Last 3 Encounters:  06/26/19 159 lb 14.4 oz (72.5 kg)  10/15/18 158 lb (71.7 kg)  08/20/18 157 lb (71.2 kg)   BMI Readings from Last 3 Encounters:   06/26/19 25.04 kg/m  10/15/18 24.75 kg/m  08/20/18 24.59 kg/m    Lipids:  Lab Results  Component Value Date   CHOL 160 01/24/2018   CHOL 154 09/05/2016   CHOL 135 03/29/2012   Lab Results  Component Value Date   HDL 60 01/24/2018   HDL 69 09/05/2016   HDL 52 03/29/2012   Lab Results  Component Value Date   LDLCALC 89 01/24/2018   LDLCALC 77 09/05/2016   LDLCALC 77 03/29/2012   Lab Results  Component Value Date   TRIG 36 01/24/2018   TRIG 40 09/05/2016   TRIG 28 03/29/2012   Lab Results  Component Value Date   CHOLHDL 2.7 01/24/2018   CHOLHDL 2.2 09/05/2016   CHOLHDL 2.6 03/29/2012   No results found for: LDLDIRECT Based on the results of lipid panel his/her cardiovascular risk factor ( using [facility] )  in the next 10 years is: The ASCVD Risk score Mikey Bussing DC Jr., et al., 2013) failed to calculate for the following reasons:   The 2013 ASCVD risk score is only valid for ages 41 to 40 Glucose:  Glucose, Bld  Date Value Ref Range Status  01/24/2018 78 65 - 99 mg/dL Final    Comment:    .            Fasting reference interval .   09/05/2016 68 65 - 99 mg/dL Final  03/29/2012 83 70 - 99 mg/dL Final    Social History      She  reports that she has never smoked. She has never used smokeless tobacco. She reports current alcohol use of about 1.0 standard drinks of alcohol per week. She reports that she does not use drugs.       Social History   Socioeconomic History  . Marital status: Divorced    Spouse name: Not on file  . Number of children: Not on file  . Years of education: 51  . Highest education level: Bachelor's degree (e.g., BA, AB, BS)  Occupational History    Comment: Nurse  Social Needs  . Financial resource strain: Not hard at all  . Food insecurity    Worry: Never true    Inability: Never true  . Transportation needs    Medical: No    Non-medical: No  Tobacco Use  . Smoking status: Never Smoker  . Smokeless tobacco: Never Used   Substance and Sexual Activity  . Alcohol use: Yes    Alcohol/week: 1.0 standard drinks    Types: 1 Shots of liquor per week    Comment: ocassionally (3-4 x a month)  . Drug use: No  . Sexual activity: Yes    Partners: Male    Birth control/protection: I.U.D.  Lifestyle  . Physical activity  Days per week: 2 days    Minutes per session: 30 min  . Stress: Not at all  Relationships  . Social connections    Talks on phone: More than three times a week    Gets together: More than three times a week    Attends religious service: More than 4 times per year    Active member of club or organization: No    Attends meetings of clubs or organizations: Never    Relationship status: Divorced  Other Topics Concern  . Not on file  Social History Narrative  . Not on file    Family History        Family Status  Relation Name Status  . Mother  Alive  . Father  Alive  . MGM  Alive  . MGF  Deceased       car accident  . PGM  Deceased  . PGF  Deceased  . Brother 1 Alive        Her family history includes Diabetes in her mother; Heart disease in her maternal grandfather; Hyperlipidemia in her maternal grandfather; Hypertension in her maternal grandfather, maternal grandmother, and mother.       Family History  Problem Relation Age of Onset  . Diabetes Mother   . Hypertension Mother   . Hypertension Maternal Grandmother   . Hyperlipidemia Maternal Grandfather   . Hypertension Maternal Grandfather   . Heart disease Maternal Grandfather     Patient Active Problem List   Diagnosis Date Noted  . Iron deficiency anemia 01/25/2018  . Elevated blood protein 01/25/2018  . Fibroids 09/16/2015  . Menorrhagia 09/03/2015  . Dermatitis 05/16/2013  . Health care maintenance 03/29/2012  . Contraception, device intrauterine 03/29/2012    Past Surgical History:  Procedure Laterality Date  . WISDOM TOOTH EXTRACTION  2008     Current Outpatient Medications:  .  cholecalciferol (VITAMIN  D) 1000 units tablet, Take 1,000 Units by mouth daily., Disp: , Rfl:  .  ferrous sulfate 325 (65 FE) MG tablet, Take 325 mg by mouth daily with breakfast., Disp: , Rfl:  .  ibuprofen (ADVIL,MOTRIN) 800 MG tablet, Take 1 tablet (800 mg total) by mouth 3 (three) times daily with meals as needed for headache or moderate pain (menorrhagia)., Disp: 30 tablet, Rfl: 5 .  Multiple Vitamin (MULTIVITAMIN) tablet, Take 1 tablet by mouth daily., Disp: , Rfl:   No Known Allergies  Patient Care Team: Delsa Grana, PA-C as PCP - General (Family Medicine)  I personally reviewed active problem list, medication list, allergies, family history, social history, health maintenance, notes from last encounter, lab results with the patient/caregiver today.  Review of Systems  Constitutional: Negative.  Negative for activity change, appetite change, fatigue and unexpected weight change.  HENT: Negative.   Eyes: Negative.   Respiratory: Negative.  Negative for cough, choking, chest tightness, shortness of breath and wheezing.   Cardiovascular: Negative.  Negative for chest pain, palpitations and leg swelling.  Gastrointestinal: Negative.  Negative for abdominal pain, blood in stool, constipation, diarrhea, nausea and vomiting.  Endocrine: Positive for cold intolerance. Negative for heat intolerance, polydipsia, polyphagia and polyuria.  Genitourinary: Negative.  Negative for difficulty urinating, flank pain, pelvic pain and urgency.  Musculoskeletal: Negative.  Negative for arthralgias, gait problem, joint swelling and myalgias.  Skin: Negative.  Negative for color change, pallor and rash.  Allergic/Immunologic: Negative.   Neurological: Negative.  Negative for dizziness, syncope, weakness, light-headedness and headaches.  Hematological: Negative.   Psychiatric/Behavioral:  Negative.  Negative for confusion, dysphoric mood, self-injury and suicidal ideas. The patient is not nervous/anxious.          Objective:    Vitals:  Vitals:   06/26/19 0833  BP: 132/84  Pulse: 80  Resp: 14  Temp: (!) 97.1 F (36.2 C)  SpO2: 99%  Weight: 159 lb 14.4 oz (72.5 kg)  Height: 5' 7"  (1.702 m)    Body mass index is 25.04 kg/m.  Physical Exam Vitals signs and nursing note reviewed.  Constitutional:      General: She is not in acute distress.    Appearance: Normal appearance. She is well-developed. She is not ill-appearing, toxic-appearing or diaphoretic.  HENT:     Head: Normocephalic and atraumatic.     Right Ear: External ear normal.     Left Ear: External ear normal.     Nose: Nose normal. No mucosal edema, congestion or rhinorrhea.     Right Turbinates: Enlarged.     Left Turbinates: Enlarged.     Mouth/Throat:     Mouth: Mucous membranes are moist.     Pharynx: Oropharynx is clear. Uvula midline. No pharyngeal swelling, oropharyngeal exudate, posterior oropharyngeal erythema or uvula swelling.  Eyes:     General: Lids are normal. No scleral icterus.       Right eye: No discharge.        Left eye: No discharge.     Conjunctiva/sclera: Conjunctivae normal.     Pupils: Pupils are equal, round, and reactive to light.  Neck:     Musculoskeletal: Normal range of motion and neck supple.     Thyroid: No thyroid mass, thyromegaly or thyroid tenderness.     Trachea: Phonation normal. No tracheal deviation.  Cardiovascular:     Rate and Rhythm: Normal rate and regular rhythm.     Pulses: Normal pulses.          Radial pulses are 2+ on the right side and 2+ on the left side.       Posterior tibial pulses are 2+ on the right side and 2+ on the left side.     Heart sounds: Normal heart sounds. No murmur. No friction rub. No gallop.   Pulmonary:     Effort: Pulmonary effort is normal. No respiratory distress.     Breath sounds: Normal breath sounds. No stridor. No wheezing, rhonchi or rales.  Chest:     Chest wall: No tenderness.     Breasts:        Right: Normal. No swelling, bleeding, inverted nipple,  mass, nipple discharge, skin change or tenderness.        Left: Normal. No swelling, bleeding, inverted nipple, mass, nipple discharge, skin change or tenderness.  Abdominal:     General: Bowel sounds are normal. There is no distension.     Palpations: Abdomen is soft. There is mass. There is no hepatomegaly or splenomegaly.     Tenderness: There is no abdominal tenderness. There is no right CVA tenderness, left CVA tenderness, guarding or rebound. Negative signs include Murphy's sign and McBurney's sign.     Hernia: No hernia is present.     Comments: Firm pelvic mass palpated up to umbilicus  Musculoskeletal: Normal range of motion.        General: No deformity.     Right lower leg: No edema.     Left lower leg: No edema.  Lymphadenopathy:     Cervical: No cervical adenopathy.     Upper Body:  Right upper body: No supraclavicular, axillary or pectoral adenopathy.     Left upper body: No supraclavicular, axillary or pectoral adenopathy.  Skin:    General: Skin is warm and dry.     Capillary Refill: Capillary refill takes less than 2 seconds.     Coloration: Skin is not ashen, cyanotic, jaundiced or pale.     Findings: No bruising or rash.     Nails: There is no clubbing.      Comments: Skin survey done - no suspicious lesions  Neurological:     Mental Status: She is alert and oriented to person, place, and time.     Motor: No weakness or abnormal muscle tone.     Coordination: Coordination normal.     Gait: Gait normal.  Psychiatric:        Mood and Affect: Mood normal.        Speech: Speech normal.        Behavior: Behavior normal.      Fall Risk: Fall Risk  06/26/2019 01/24/2018  Falls in the past year? 0 No  Number falls in past yr: 0 -  Injury with Fall? 0 -    Functional Status Survey: Is the patient deaf or have difficulty hearing?: No Does the patient have difficulty seeing, even when wearing glasses/contacts?: No Does the patient have difficulty concentrating,  remembering, or making decisions?: No Does the patient have difficulty walking or climbing stairs?: No Does the patient have difficulty dressing or bathing?: No Does the patient have difficulty doing errands alone such as visiting a doctor's office or shopping?: No   Assessment & Plan:    CPE completed today  . USPSTF grade A and B recommendations reviewed with patient; age-appropriate recommendations, preventive care, screening tests, etc discussed and encouraged; healthy living encouraged; see AVS for patient education given to patient  . Discussed importance of 150 minutes of physical activity weekly, AHA exercise recommendations given to pt in AVS/handout  . Discussed importance of healthy diet:  eating lean meats and proteins, avoiding trans fats and saturated fats, avoid simple sugars and excessive carbs in diet, eat 6 servings of fruit/vegetables daily and drink plenty of water and avoid sweet beverages.    . Recommended pt to do annual eye exam and routine dental exams/cleanings  . Depression, alcohol, fall screening completed as documented above and per flowsheets, and negative  . Reviewed Health Maintenance: Health Maintenance  Topic Date Due  . HIV Screening  05/17/1999  . INFLUENZA VACCINE  05/25/2019  . PAP SMEAR-Modifier  09/06/2019  . TETANUS/TDAP  05/16/2028  Declines HIV and will get flu from work PAP per OBGYN  . Immunizations: Immunization History  Administered Date(s) Administered  . Influenza-Unspecified 07/25/2015  . Tdap 10/24/2005, 05/16/2018    Problem List Items Addressed This Visit      Other   Fibroids    Palpable, large, no sx currently, watch for urinary outflow obstruction Managed per OBGYN       Other Visit Diagnoses    Encounter for other general examination    -  Primary   Relevant Orders   CBC (INCLUDES DIFF/PLT) WITH PATHOLOGIST REVIEW   COMPLETE METABOLIC PANEL WITH GFR   Lipid panel   Hemoglobin A1c   Microcytic anemia        recheck iron and CBC with blood smear, MCV very low, concern for possibly other cause of anemia - may need hematology referral or more labs   Relevant Medications   ferrous  sulfate 325 (65 FE) MG tablet   Other Relevant Orders   CBC (INCLUDES DIFF/PLT) WITH PATHOLOGIST REVIEW   Iron, TIBC and Ferritin Panel   Screening for lipoid disorders       Relevant Orders   Lipid panel   Screening for endocrine, nutritional, metabolic and immunity disorder       Relevant Orders   CBC (INCLUDES DIFF/PLT) WITH PATHOLOGIST REVIEW   COMPLETE METABOLIC PANEL WITH GFR   Lipid panel   Hemoglobin A1c   Vitamin D deficiency       very low consistently in the past, even with supplementation, recheck labs   Relevant Orders   VITAMIN D 25 Hydroxy (Vit-D Deficiency, Fractures)   Screening for malignant neoplasm of breast       Relevant Orders   MM 3D SCREEN BREAST BILATERAL      Delsa Grana, PA-C 06/26/19 1:43 PM  [facility]

## 2019-06-26 NOTE — Patient Instructions (Addendum)
Return home stool cards when you can - we will place the order when you bring it back and that way rule out any blood loss from GI sources.  We will call you with your labs - and will update you on the plan for anemia at that time.   Preventive Care 67-35 Years Old, Female Preventive care refers to visits with your health care provider and lifestyle choices that can promote health and wellness. This includes:  A yearly physical exam. This may also be called an annual well check.  Regular dental visits and eye exams.  Immunizations.  Screening for certain conditions.  Healthy lifestyle choices, such as eating a healthy diet, getting regular exercise, not using drugs or products that contain nicotine and tobacco, and limiting alcohol use. What can I expect for my preventive care visit? Physical exam Your health care provider will check your:  Height and weight. This may be used to calculate body mass index (BMI), which tells if you are at a healthy weight.  Heart rate and blood pressure.  Skin for abnormal spots. Counseling Your health care provider may ask you questions about your:  Alcohol, tobacco, and drug use.  Emotional well-being.  Home and relationship well-being.  Sexual activity.  Eating habits.  Work and work Statistician.  Method of birth control.  Menstrual cycle.  Pregnancy history. What immunizations do I need?  Influenza (flu) vaccine  This is recommended every year. Tetanus, diphtheria, and pertussis (Tdap) vaccine  You may need a Td booster every 10 years. Varicella (chickenpox) vaccine  You may need this if you have not been vaccinated. Human papillomavirus (HPV) vaccine  If recommended by your health care provider, you may need three doses over 6 months. Measles, mumps, and rubella (MMR) vaccine  You may need at least one dose of MMR. You may also need a second dose. Meningococcal conjugate (MenACWY) vaccine  One dose is recommended if  you are age 37-21 years and a first-year college student living in a residence hall, or if you have one of several medical conditions. You may also need additional booster doses. Pneumococcal conjugate (PCV13) vaccine  You may need this if you have certain conditions and were not previously vaccinated. Pneumococcal polysaccharide (PPSV23) vaccine  You may need one or two doses if you smoke cigarettes or if you have certain conditions. Hepatitis A vaccine  You may need this if you have certain conditions or if you travel or work in places where you may be exposed to hepatitis A. Hepatitis B vaccine  You may need this if you have certain conditions or if you travel or work in places where you may be exposed to hepatitis B. Haemophilus influenzae type b (Hib) vaccine  You may need this if you have certain conditions. You may receive vaccines as individual doses or as more than one vaccine together in one shot (combination vaccines). Talk with your health care provider about the risks and benefits of combination vaccines. What tests do I need?  Blood tests  Lipid and cholesterol levels. These may be checked every 5 years starting at age 8.  Hepatitis C test.  Hepatitis B test. Screening  Diabetes screening. This is done by checking your blood sugar (glucose) after you have not eaten for a while (fasting).  Sexually transmitted disease (STD) testing.  BRCA-related cancer screening. This may be done if you have a family history of breast, ovarian, tubal, or peritoneal cancers.  Pelvic exam and Pap test. This may  be done every 3 years starting at age 40. Starting at age 10, this may be done every 5 years if you have a Pap test in combination with an HPV test. Talk with your health care provider about your test results, treatment options, and if necessary, the need for more tests. Follow these instructions at home: Eating and drinking   Eat a diet that includes fresh fruits and  vegetables, whole grains, lean protein, and low-fat dairy.  Take vitamin and mineral supplements as recommended by your health care provider.  Do not drink alcohol if: ? Your health care provider tells you not to drink. ? You are pregnant, may be pregnant, or are planning to become pregnant.  If you drink alcohol: ? Limit how much you have to 0-1 drink a day. ? Be aware of how much alcohol is in your drink. In the [place] one drink equals one 12 oz bottle of beer (355 mL), one 5 oz glass of wine (148 mL), or one 1 oz glass of hard liquor (44 mL). Lifestyle  Take daily care of your teeth and gums.  Stay active. Exercise for at least 30 minutes on 5 or more days each week.  Do not use any products that contain nicotine or tobacco, such as cigarettes, e-cigarettes, and chewing tobacco. If you need help quitting, ask your health care provider.  If you are sexually active, practice safe sex. Use a condom or other form of birth control (contraception) in order to prevent pregnancy and STIs (sexually transmitted infections). If you plan to become pregnant, see your health care provider for a preconception visit. What's next?  Visit your health care provider once a year for a well check visit.  Ask your health care provider how often you should have your eyes and teeth checked.  Stay up to date on all vaccines. This information is not intended to replace advice given to you by your health care provider. Make sure you discuss any questions you have with your health care provider. Document Released: 12/06/2001 Document Revised: 06/21/2018 Document Reviewed: 06/21/2018 Elsevier Patient Education  2020 [employer].

## 2019-06-26 NOTE — Assessment & Plan Note (Signed)
Palpable, large, no sx currently, watch for urinary outflow obstruction Managed per [facility]

## 2019-06-27 ENCOUNTER — Encounter: Payer: Self-pay | Admitting: Family Medicine

## 2019-06-27 MED ORDER — VITAMIN D (ERGOCALCIFEROL) 1.25 MG (50000 UNIT) PO CAPS: 50000.0000 [IU] | ORAL_CAPSULE | ORAL | 0 refills | Status: DC

## 2019-06-27 MED FILL — VIT D2 1.25 MG (50,000 UNIT: 1.25 MG | 84 days supply | Qty: 12 | Fill #0

## 2019-06-27 NOTE — Addendum Note (Signed)
Addended by: Delsa Grana on: 06/27/2019 11:29 AM   Modules accepted: Orders

## 2019-06-28 ENCOUNTER — Other Ambulatory Visit: Payer: Self-pay | Admitting: Family Medicine

## 2019-06-28 ENCOUNTER — Encounter: Payer: Self-pay | Admitting: Family Medicine

## 2019-06-28 LAB — CBC (INCLUDES DIFF/PLT) WITH PATHOLOGIST REVIEW
Absolute Monocytes: 583 cells/uL (ref 200–950)
Basophils Absolute: 19 cells/uL (ref 0–200)
Basophils Relative: 0.4 %
Eosinophils Absolute: 38 cells/uL (ref 15–500)
Eosinophils Relative: 0.8 %
HCT: 41.3 % (ref 35.0–45.0)
Hemoglobin: 11.5 g/dL — ABNORMAL LOW (ref 11.7–15.5)
Lymphs Abs: 1241 cells/uL (ref 850–3900)
MCH: 20.1 pg — ABNORMAL LOW (ref 27.0–33.0)
MCHC: 27.8 g/dL — ABNORMAL LOW (ref 32.0–36.0)
MCV: 72.2 fL — ABNORMAL LOW (ref 80.0–100.0)
MPV: 11 fL (ref 7.5–12.5)
Monocytes Relative: 12.4 %
Neutro Abs: 2820 cells/uL (ref 1500–7800)
Neutrophils Relative %: 60 %
Platelets: 380 10*3/uL (ref 140–400)
RBC: 5.72 10*6/uL — ABNORMAL HIGH (ref 3.80–5.10)
RDW: 20.1 % — ABNORMAL HIGH (ref 11.0–15.0)
Total Lymphocyte: 26.4 %
WBC: 4.7 10*3/uL (ref 3.8–10.8)

## 2019-06-28 LAB — HEMOGLOBIN A1C
Hgb A1c MFr Bld: 5.2 % of total Hgb (ref <5.7)
Mean Plasma Glucose: 103 (calc)
eAG (mmol/L): 5.7 (calc)

## 2019-06-28 LAB — LIPID PANEL
Cholesterol: 156 mg/dL (ref <200)
HDL: 58 mg/dL (ref >=50)
LDL Cholesterol (Calc): 85 mg/dL (calc)
Non-HDL Cholesterol (Calc): 98 mg/dL (calc) (ref <130)
Total CHOL/HDL Ratio: 2.7 (calc) (ref <5.0)
Triglycerides: 58 mg/dL (ref <150)

## 2019-06-28 LAB — COMPLETE METABOLIC PANEL WITH GFR
AG Ratio: 1.2 (calc) (ref 1.0–2.5)
ALT: 12 U/L (ref 6–29)
AST: 18 U/L (ref 10–30)
Albumin: 4.3 g/dL (ref 3.6–5.1)
Alkaline phosphatase (APISO): 85 U/L (ref 31–125)
BUN: 11 mg/dL (ref 7–25)
CO2: 27 mmol/L (ref 20–32)
Calcium: 9.7 mg/dL (ref 8.6–10.2)
Chloride: 105 mmol/L (ref 98–110)
Creat: 0.82 mg/dL (ref 0.50–1.10)
GFR, Est African American: 107 mL/min/{1.73_m2} (ref >=60)
GFR, Est Non African American: 93 mL/min/{1.73_m2} (ref >=60)
Globulin: 3.7 g/dL (calc) (ref 1.9–3.7)
Glucose, Bld: 83 mg/dL (ref 65–99)
Potassium: 4.2 mmol/L (ref 3.5–5.3)
Sodium: 140 mmol/L (ref 135–146)
Total Bilirubin: 0.3 mg/dL (ref 0.2–1.2)
Total Protein: 8 g/dL (ref 6.1–8.1)

## 2019-06-28 LAB — TEST AUTHORIZATION

## 2019-06-28 LAB — IRON,TIBC AND FERRITIN PANEL
%SAT: 9 % (calc) — ABNORMAL LOW (ref 16–45)
Ferritin: 11 ng/mL — ABNORMAL LOW (ref 16–154)
Iron: 41 ug/dL (ref 40–190)
TIBC: 441 mcg/dL (calc) (ref 250–450)

## 2019-06-28 LAB — B12 AND FOLATE PANEL
Folate: 12.9 ng/mL
Vitamin B-12: 495 pg/mL (ref 200–1100)

## 2019-06-28 LAB — VITAMIN D 25 HYDROXY (VIT D DEFICIENCY, FRACTURES): Vit D, 25-Hydroxy: 17 ng/mL — ABNORMAL LOW (ref 30–100)

## 2019-06-28 NOTE — Progress Notes (Unsigned)
Blood smear alludes to what I suspected, possible thalassemia Hgb levels have improved with iron supplementation, but she remains microcytic and hypochromic. Will refer to hematology for further work up - feel it is indicated to dx the type, determine any possible tx or future complications (ie: iron overload, skeletal changes etc).  Adding on B12 and folate, to see if folate supplementation would be helpful And will defer electrophoresis or genetic testing to hematology If pt wishes to do here will, have her come back for labs.  Would still urge referral if pt appears to look transfusion dependent.  Delsa Grana, PA-C

## 2019-06-28 NOTE — Addendum Note (Signed)
Addended by: Delsa Grana on: 06/28/2019 02:45 PM   Modules accepted: Orders

## 2019-07-04 ENCOUNTER — Encounter: Payer: Self-pay | Admitting: Oncology

## 2019-07-04 ENCOUNTER — Other Ambulatory Visit: Payer: Self-pay

## 2019-07-04 NOTE — Progress Notes (Signed)
Patient is coming in as new patient. She is doing well no complaints. She would like [place] to provide living will and power of attorney information

## 2019-07-05 ENCOUNTER — Other Ambulatory Visit: Payer: Self-pay

## 2019-07-05 ENCOUNTER — Inpatient Hospital Stay: Payer: 59 | Attending: Oncology | Admitting: Oncology

## 2019-07-05 VITALS — BP 138/82 | HR 82 | Temp 98.2°F | Resp 16 | Wt 158.9 lb

## 2019-07-05 DIAGNOSIS — D509 Iron deficiency anemia, unspecified: Secondary | ICD-10-CM | POA: Diagnosis not present

## 2019-07-05 DIAGNOSIS — Z79899 Other long term (current) drug therapy: Secondary | ICD-10-CM | POA: Diagnosis not present

## 2019-07-05 DIAGNOSIS — D259 Leiomyoma of uterus, unspecified: Secondary | ICD-10-CM | POA: Diagnosis not present

## 2019-07-05 NOTE — Progress Notes (Signed)
Pt been having anemia x 2 years. She has started iron pills and takes one a day- she has been taking it every day for about a month

## 2019-07-08 ENCOUNTER — Encounter: Payer: Self-pay | Admitting: Oncology

## 2019-07-08 NOTE — Progress Notes (Signed)
Hematology/Oncology Consult note [facility] [phone] [phone]  Patient Care Team: Delsa Grana, Hershal Coria as PCP - General (Family Medicine)   Name of the patient: Alicia Larson  [id number]  September 25, 1986    Reason for referral-microcytic anemia   Referring physician-Leisa Lucio Edward, PA  Date of visit: 07/08/19   History of presenting illness-patient is a 33 year old African-American female with a past medical history significant for iron deficiency anemia and fibroids. Most recent blood work from 06/26/2019 showed white count of 4.7, H&H of 11.5/41.3 with an MCV of 72 and a platelet count of 380.  Iron study showed a low ferritin of 11 and low iron saturation of 9%.  B12 and folate were normal.  Prior to that her hemoglobin was 8.7 with an MCV of 64 in April 2019.  Patient does have large fibroids and has been following up with GYN.  She does not report particularly heavy menstrual cycles.  Denies any family history of colon cancer.  Denies any blood in her stool or urine.  Denies any unintentional weight loss.  No family history of blood disorders.  ECOG PS- 0  Pain scale- 0   Review of systems- Review of Systems  Constitutional: Negative for chills, fever, malaise/fatigue and weight loss.  HENT: Negative for congestion, ear discharge and nosebleeds.   Eyes: Negative for blurred vision.  Respiratory: Negative for cough, hemoptysis, sputum production, shortness of breath and wheezing.   Cardiovascular: Negative for chest pain, palpitations, orthopnea and claudication.  Gastrointestinal: Negative for abdominal pain, blood in stool, constipation, diarrhea, heartburn, melena, nausea and vomiting.  Genitourinary: Negative for dysuria, flank pain, frequency, hematuria and urgency.  Musculoskeletal: Negative for back pain, joint pain and myalgias.  Skin: Negative for rash.  Neurological: Negative for dizziness, tingling, focal weakness, seizures,  weakness and headaches.  Endo/Heme/Allergies: Does not bruise/bleed easily.  Psychiatric/Behavioral: Negative for depression and suicidal ideas. The patient does not have insomnia.     No Known Allergies  Patient Active Problem List   Diagnosis Date Noted  . Iron deficiency anemia 01/25/2018  . Fibroids 09/16/2015  . Contraception, device intrauterine 03/29/2012     Past Medical History:  Diagnosis Date  . Anemia   . Dermatitis 05/16/2013   02/28/2013. [place]. [place] [facility].  Dermatitis-Nummular (arms,legs,buttocks) Treatment Plan: Triamincinolone 0.1% Cream apply twice daily. Tinea versicolor (back x) Treatment Plan: Selenium Sulfide Wash twice weekly. Ketoconazole Cream aaa bid x 3 wks.   . Elevated blood protein 01/25/2018  . Fibroids 09/16/2015  . Menorrhagia 09/03/2015     Past Surgical History:  Procedure Laterality Date  . WISDOM TOOTH EXTRACTION  2008    Social History   Socioeconomic History  . Marital status: Divorced    Spouse name: Not on file  . Number of children: Not on file  . Years of education: 93  . Highest education level: Bachelor's degree (e.g., BA, AB, BS)  Occupational History    Comment: Nurse  Social Needs  . Financial resource strain: Not hard at all  . Food insecurity    Worry: Never true    Inability: Never true  . Transportation needs    Medical: No    Non-medical: No  Tobacco Use  . Smoking status: Never Smoker  . Smokeless tobacco: Never Used  Substance and Sexual Activity  . Alcohol use: Yes    Alcohol/week: 1.0 standard drinks    Types: 1 Shots of liquor per week  Comment: ocassionally (3-4 x a month)  . Drug use: No  . Sexual activity: Yes    Partners: Male    Birth control/protection: I.U.D.  Lifestyle  . Physical activity    Days per week: 2 days    Minutes per session: 30 min  . Stress: Not at all  Relationships  . Social connections    Talks on phone: More than three times  a week    Gets together: More than three times a week    Attends religious service: More than 4 times per year    Active member of club or organization: No    Attends meetings of clubs or organizations: Never    Relationship status: Divorced  . Intimate partner violence    Fear of current or ex partner: No    Emotionally abused: No    Physically abused: No    Forced sexual activity: No  Other Topics Concern  . Not on file  Social History Narrative  . Not on file     Family History  Problem Relation Age of Onset  . Diabetes Mother   . Hypertension Mother   . Hypertension Maternal Grandmother   . Hyperlipidemia Maternal Grandfather   . Hypertension Maternal Grandfather   . Heart disease Maternal Grandfather   . Leukemia Maternal Uncle      Current Outpatient Medications:  .  cholecalciferol (VITAMIN D) 1000 units tablet, Take 1,000 Units by mouth daily., Disp: , Rfl:  .  ferrous sulfate 325 (65 FE) MG tablet, Take 325 mg by mouth daily with breakfast., Disp: , Rfl:  .  ibuprofen (ADVIL,MOTRIN) 800 MG tablet, Take 1 tablet (800 mg total) by mouth 3 (three) times daily with meals as needed for headache or moderate pain (menorrhagia)., Disp: 30 tablet, Rfl: 5 .  Multiple Vitamin (MULTIVITAMIN) tablet, Take 1 tablet by mouth daily., Disp: , Rfl:  .  Vitamin D, Ergocalciferol, (DRISDOL) 1.25 MG (50000 UT) CAPS capsule, Take 1 capsule (50,000 Units total) by mouth every 7 (seven) days. x12 weeks., Disp: 12 capsule, Rfl: 0   Physical exam:  Vitals:   07/05/19 1058  BP: 138/82  Pulse: 82  Resp: 16  Temp: 98.2 F (36.8 C)  TempSrc: Tympanic  Weight: 158 lb 14.4 oz (72.1 kg)   Physical Exam HENT:     Head: Normocephalic and atraumatic.  Eyes:     Pupils: Pupils are equal, round, and reactive to light.  Neck:     Musculoskeletal: Normal range of motion.  Cardiovascular:     Rate and Rhythm: Normal rate and regular rhythm.     Heart sounds: Normal heart sounds.  Pulmonary:      Effort: Pulmonary effort is normal.     Breath sounds: Normal breath sounds.  Abdominal:     General: Bowel sounds are normal.     Palpations: Abdomen is soft.     Comments: Palpable intra-abdominal masses consistent with fibroids  Skin:    General: Skin is warm and dry.  Neurological:     Mental Status: She is alert and oriented to person, place, and time.        CMP Latest Ref Rng & Units 06/26/2019  Glucose 65 - 99 mg/dL 83  BUN 7 - 25 mg/dL 11  Creatinine 0.50 - 1.10 mg/dL 0.82  Sodium 135 - 146 mmol/L 140  Potassium 3.5 - 5.3 mmol/L 4.2  Chloride 98 - 110 mmol/L 105  CO2 20 - 32 mmol/L 27  Calcium 8.6 -  10.2 mg/dL 9.7  Total Protein 6.1 - 8.1 g/dL 8.0  Total Bilirubin 0.2 - 1.2 mg/dL 0.3  Alkaline Phos 33 - 115 U/L -  AST 10 - 30 U/L 18  ALT 6 - 29 U/L 12   CBC Latest Ref Rng & Units 06/26/2019  WBC 3.8 - 10.8 Thousand/uL 4.7  Hemoglobin 11.7 - 15.5 g/dL 11.5(L)  Hematocrit 35.0 - 45.0 % 41.3  Platelets 140 - 400 Thousand/uL 380    Assessment and plan- Patient is a 33 y.o. female referred for microcytic anemia  Patient's hemoglobin was down to 8.4 last year.  She did have evidence of microcytic anemia when her MCV was 64 last year.  Patient started taking oral iron and her hemoglobin is improved to 11.5 and microcytosis improved to 72.  Recent iron studies continue to show iron deficiency.  I suspect her microcytosis is secondary to iron deficiency.  I would like that to be corrected first before testing for possible thalassemia.  Patient would like to continue trial of oral iron and would not like to get IV iron at this time.  I will see her back in 3 months time with CBC ferritin and iron studies and check a hemoglobin electrophoresis at that time.  If patient's anemia does not improve despite oral iron for 3 months she is willing to try IV iron.  With regards to etiology of iron deficiency anemia although patient has large fibroids she does not report any heavy  menstrual bleeding.  She would like to hold off on GI evaluation at this time.   Thank you for this kind referral and the opportunity to participate in the care of this patient   Visit Diagnosis 1. Iron deficiency anemia, unspecified iron deficiency anemia type     Dr. Randa Evens, MD, MPH [facility] at [facility] [id number] 07/08/2019  3:10 PM

## 2019-07-09 ENCOUNTER — Other Ambulatory Visit: Payer: Self-pay

## 2019-07-09 ENCOUNTER — Other Ambulatory Visit (INDEPENDENT_AMBULATORY_CARE_PROVIDER_SITE_OTHER): Payer: 59

## 2019-07-09 DIAGNOSIS — D509 Iron deficiency anemia, unspecified: Secondary | ICD-10-CM | POA: Diagnosis not present

## 2019-07-09 LAB — POC HEMOCCULT BLD/STL (HOME/3-CARD/SCREEN)
Card #1 Date: 9142020
Card #2 Date: 9152020
Card #2 Fecal Occult Blod, POC: POSITIVE
Card #3 Date: 9162020
Card #3 Fecal Occult Blood, POC: NEGATIVE
Fecal Occult Blood, POC: NEGATIVE

## 2019-07-10 ENCOUNTER — Encounter: Payer: Self-pay | Admitting: Family Medicine

## 2019-08-13 MED FILL — HYDROCODON-APAP 5-325: 5-325 | 4 days supply | Qty: 20 | Fill #0

## 2019-08-13 MED FILL — CLINDAMYCIN HCL 150 MG CAPS: 150 | 7 days supply | Qty: 28 | Fill #0

## 2019-10-10 ENCOUNTER — Inpatient Hospital Stay: Payer: 59

## 2019-10-10 ENCOUNTER — Inpatient Hospital Stay: Payer: 59 | Admitting: Oncology

## 2019-10-30 ENCOUNTER — Other Ambulatory Visit: Payer: Self-pay

## 2019-10-30 ENCOUNTER — Inpatient Hospital Stay: Payer: 59 | Attending: Oncology

## 2019-10-30 DIAGNOSIS — D509 Iron deficiency anemia, unspecified: Secondary | ICD-10-CM | POA: Insufficient documentation

## 2019-10-30 LAB — CBC WITH DIFFERENTIAL/PLATELET
Abs Immature Granulocytes: 0.01 10*3/uL (ref 0.00–0.07)
Basophils Absolute: 0 10*3/uL (ref 0.0–0.1)
Basophils Relative: 0 %
Eosinophils Absolute: 0 10*3/uL (ref 0.0–0.5)
Eosinophils Relative: 1 %
HCT: 42.4 % (ref 36.0–46.0)
Hemoglobin: 12.7 g/dL (ref 12.0–15.0)
Immature Granulocytes: 0 %
Lymphocytes Relative: 21 %
Lymphs Abs: 1.2 10*3/uL (ref 0.7–4.0)
MCH: 23 pg — ABNORMAL LOW (ref 26.0–34.0)
MCHC: 30 g/dL (ref 30.0–36.0)
MCV: 76.7 fL — ABNORMAL LOW (ref 80.0–100.0)
Monocytes Absolute: 0.6 10*3/uL (ref 0.1–1.0)
Monocytes Relative: 10 %
Neutro Abs: 3.9 10*3/uL (ref 1.7–7.7)
Neutrophils Relative %: 68 %
Platelets: 264 10*3/uL (ref 150–400)
RBC: 5.53 MIL/uL — ABNORMAL HIGH (ref 3.87–5.11)
RDW: 15.1 % (ref 11.5–15.5)
WBC: 5.8 10*3/uL (ref 4.0–10.5)
nRBC: 0 % (ref 0.0–0.2)

## 2019-10-30 LAB — IRON AND TIBC
Iron: 95 ug/dL (ref 28–170)
Saturation Ratios: 23 % (ref 10.4–31.8)
TIBC: 419 ug/dL (ref 250–450)
UIBC: 324 ug/dL

## 2019-10-30 LAB — FERRITIN: Ferritin: 12 ng/mL (ref 11–307)

## 2019-10-31 LAB — HEMOGLOBINOPATHY EVALUATION
Hgb A2 Quant: 1.8 % (ref 1.8–3.2)
Hgb A: 98.2 % (ref 96.4–98.8)
Hgb C: 0 %
Hgb F Quant: 0 % (ref 0.0–2.0)
Hgb S Quant: 0 %
Hgb Variant: 0 %

## 2019-11-01 ENCOUNTER — Inpatient Hospital Stay (HOSPITAL_BASED_OUTPATIENT_CLINIC_OR_DEPARTMENT_OTHER): Payer: 59 | Admitting: Oncology

## 2019-11-01 ENCOUNTER — Other Ambulatory Visit: Payer: Self-pay

## 2019-11-01 ENCOUNTER — Encounter: Payer: Self-pay | Admitting: Oncology

## 2019-11-01 DIAGNOSIS — D509 Iron deficiency anemia, unspecified: Secondary | ICD-10-CM | POA: Diagnosis not present

## 2019-11-01 NOTE — Progress Notes (Signed)
Patient stated that she had been doing well with no complaints. 

## 2019-11-04 NOTE — Progress Notes (Signed)
I connected with Alicia Larson on 11/04/19 at 10:15 AM EST by video enabled telemedicine visit and verified that I am speaking with the correct person using two identifiers.   I discussed the limitations, risks, security and privacy concerns of performing an evaluation and management service by telemedicine and the availability of in-person appointments. I also discussed with the patient that there may be a patient responsible charge related to this service. The patient expressed understanding and agreed to proceed.  Other persons participating in the visit and their role in the encounter:  none  Patient's location:  work Provider's location:  Work  Diagnosis: Iron deficiency anemia  Chief Complaint: Routine follow-up of iron deficiency anemia  History of present illness: patient is a 34 year old African-American female with a past medical history significant for iron deficiency anemia and fibroids. Most recent blood work from 06/26/2019 showed white count of 4.7, H&H of 11.5/41.3 with an MCV of 72 and a platelet count of 380.  Iron study showed a low ferritin of 11 and low iron saturation of 9%.  B12 and folate were normal.  Prior to that her hemoglobin was 8.7 with an MCV of 64 in April 2019.  Patient does have large fibroids and has been following up with GYN.  She does not report particularly heavy menstrual cycles.    Patient decided to proceed with trial of oral iron instead of receiving IV iron which has worked well for her.  Interval history: Menstrual cycles are still not heavy.  She has not undergone surgery yet for her fibroids.  She is doing well overall and denies any complaints at this time   Review of Systems  Constitutional: Negative for chills, fever, malaise/fatigue and weight loss.  HENT: Negative for congestion, ear discharge and nosebleeds.   Eyes: Negative for blurred vision.  Respiratory: Negative for cough, hemoptysis, sputum production, shortness of breath and  wheezing.   Cardiovascular: Negative for chest pain, palpitations, orthopnea and claudication.  Gastrointestinal: Negative for abdominal pain, blood in stool, constipation, diarrhea, heartburn, melena, nausea and vomiting.  Genitourinary: Negative for dysuria, flank pain, frequency, hematuria and urgency.  Musculoskeletal: Negative for back pain, joint pain and myalgias.  Skin: Negative for rash.  Neurological: Negative for dizziness, tingling, focal weakness, seizures, weakness and headaches.  Endo/Heme/Allergies: Does not bruise/bleed easily.  Psychiatric/Behavioral: Negative for depression and suicidal ideas. The patient does not have insomnia.     No Known Allergies  Past Medical History:  Diagnosis Date  . Anemia   . Dermatitis 05/16/2013   02/28/2013. [place]. [place] [facility].  Dermatitis-Nummular (arms,legs,buttocks) Treatment Plan: Triamincinolone 0.1% Cream apply twice daily. Tinea versicolor (back x) Treatment Plan: Selenium Sulfide Wash twice weekly. Ketoconazole Cream aaa bid x 3 wks.   . Elevated blood protein 01/25/2018  . Fibroids 09/16/2015  . Menorrhagia 09/03/2015    Past Surgical History:  Procedure Laterality Date  . WISDOM TOOTH EXTRACTION  2008    Social History   Socioeconomic History  . Marital status: Divorced    Spouse name: Not on file  . Number of children: Not on file  . Years of education: 40  . Highest education level: Bachelor's degree (e.g., BA, AB, BS)  Occupational History    Comment: Nurse  Tobacco Use  . Smoking status: Never Smoker  . Smokeless tobacco: Never Used  Substance and Sexual Activity  . Alcohol use: Yes    Alcohol/week: 1.0 standard drinks    Types: 1 Shots  of liquor per week    Comment: ocassionally (3-4 x a month)  . Drug use: No  . Sexual activity: Yes    Partners: Male    Birth control/protection: I.U.D.  Other Topics Concern  . Not on file  Social History Narrative  . Not on  file   Social Determinants of Health   Financial Resource Strain: Low Risk   . Difficulty of Paying Living Expenses: Not hard at all  Food Insecurity: No Food Insecurity  . Worried About Charity fundraiser in the Last Year: Never true  . Ran Out of Food in the Last Year: Never true  Transportation Needs: No Transportation Needs  . Lack of Transportation (Medical): No  . Lack of Transportation (Non-Medical): No  Physical Activity: Insufficiently Active  . Days of Exercise per Week: 2 days  . Minutes of Exercise per Session: 30 min  Stress: No Stress Concern Present  . Feeling of Stress : Not at all  Social Connections: Somewhat Isolated  . Frequency of Communication with Friends and Family: More than three times a week  . Frequency of Social Gatherings with Friends and Family: More than three times a week  . Attends Religious Services: More than 4 times per year  . Active Member of Clubs or Organizations: No  . Attends Archivist Meetings: Never  . Marital Status: Divorced  Human resources officer Violence: Not At Risk  . Fear of Current or Ex-Partner: No  . Emotionally Abused: No  . Physically Abused: No  . Sexually Abused: No    Family History  Problem Relation Age of Onset  . Diabetes Mother   . Hypertension Mother   . Hypertension Maternal Grandmother   . Hyperlipidemia Maternal Grandfather   . Hypertension Maternal Grandfather   . Heart disease Maternal Grandfather   . Leukemia Maternal Uncle      Current Outpatient Medications:  .  cholecalciferol (VITAMIN D) 1000 units tablet, Take 1,000 Units by mouth daily., Disp: , Rfl:  .  ferrous sulfate 325 (65 FE) MG tablet, Take 325 mg by mouth daily with breakfast., Disp: , Rfl:  .  ibuprofen (ADVIL,MOTRIN) 800 MG tablet, Take 1 tablet (800 mg total) by mouth 3 (three) times daily with meals as needed for headache or moderate pain (menorrhagia)., Disp: 30 tablet, Rfl: 5 .  Multiple Vitamin (MULTIVITAMIN) tablet, Take  1 tablet by mouth daily., Disp: , Rfl:  .  Vitamin D, Ergocalciferol, (DRISDOL) 1.25 MG (50000 UT) CAPS capsule, Take 1 capsule (50,000 Units total) by mouth every 7 (seven) days. x12 weeks., Disp: 12 capsule, Rfl: 0  No results found.  No images are attached to the encounter.   CMP Latest Ref Rng & Units 06/26/2019  Glucose 65 - 99 mg/dL 83  BUN 7 - 25 mg/dL 11  Creatinine 0.50 - 1.10 mg/dL 0.82  Sodium 135 - 146 mmol/L 140  Potassium 3.5 - 5.3 mmol/L 4.2  Chloride 98 - 110 mmol/L 105  CO2 20 - 32 mmol/L 27  Calcium 8.6 - 10.2 mg/dL 9.7  Total Protein 6.1 - 8.1 g/dL 8.0  Total Bilirubin 0.2 - 1.2 mg/dL 0.3  Alkaline Phos 33 - 115 U/L -  AST 10 - 30 U/L 18  ALT 6 - 29 U/L 12   CBC Latest Ref Rng & Units 10/30/2019  WBC 4.0 - 10.5 K/uL 5.8  Hemoglobin 12.0 - 15.0 g/dL 12.7  Hematocrit 36.0 - 46.0 % 42.4  Platelets 150 - 400 K/uL 264  Observation/objective: Appears in no acute distress of a video visit today.  Breathing is nonlabored   Assessment and plan: Patient is a 34 year old with iron deficiency anemia possibly secondary to fibroids.  This is a routine follow-up visit for iron deficiency anemia  I personally reviewed labs done today.  Her hemoglobin has improved from 11-12.7 after taking oral iron.  Her ferritin levels are still low at 12 but iron studies are normal.  Hemoglobinopathy evaluation was negative.  Oral iron seems to be working well for her and she will continue to take that.  No need to switch her to IV iron at this time.  Given that she is not anemic at this time I will repeat CBC ferritin iron studies in 4 months and 8 months and see her back in 8 months for a video visit  Follow-up instructions: As above  I discussed the assessment and treatment plan with the patient. The patient was provided an opportunity to ask questions and all were answered. The patient agreed with the plan and demonstrated an understanding of the instructions.   The patient was  advised to call back or seek an in-person evaluation if the symptoms worsen or if the condition fails to improve as anticipated.    Visit Diagnosis: 1. Iron deficiency anemia, unspecified iron deficiency anemia type     Dr. Randa Evens, MD, MPH [facility] at [facility] Pager- [phone] 11/04/2019 10:01 AM

## 2020-01-13 ENCOUNTER — Other Ambulatory Visit: Payer: Self-pay

## 2020-01-13 ENCOUNTER — Encounter: Payer: Self-pay | Admitting: Obstetrics & Gynecology

## 2020-01-13 ENCOUNTER — Ambulatory Visit (INDEPENDENT_AMBULATORY_CARE_PROVIDER_SITE_OTHER): Payer: 59 | Admitting: Obstetrics & Gynecology

## 2020-01-13 VITALS — BP 151/89 | HR 72 | Ht 67.0 in | Wt 169.0 lb

## 2020-01-13 DIAGNOSIS — D259 Leiomyoma of uterus, unspecified: Secondary | ICD-10-CM | POA: Diagnosis not present

## 2020-01-13 DIAGNOSIS — D219 Benign neoplasm of connective and other soft tissue, unspecified: Secondary | ICD-10-CM

## 2020-01-13 DIAGNOSIS — Z975 Presence of (intrauterine) contraceptive device: Secondary | ICD-10-CM

## 2020-01-13 DIAGNOSIS — Z1151 Encounter for screening for human papillomavirus (HPV): Secondary | ICD-10-CM

## 2020-01-13 DIAGNOSIS — Z01419 Encounter for gynecological examination (general) (routine) without abnormal findings: Secondary | ICD-10-CM

## 2020-01-13 DIAGNOSIS — Z124 Encounter for screening for malignant neoplasm of cervix: Secondary | ICD-10-CM | POA: Diagnosis not present

## 2020-01-13 NOTE — Progress Notes (Signed)
Subjective:     Alicia Larson is a 34 y.o. female here for a routine exam. G0 Menses: monthly on LnUD very light 1 week out of the month. Current complaints: pt reports freq voiding. Unable to fully fill bladder due to fibroids. No dysuria. She also reports constipation that she believes is related to the iron supplementation. She was previously anemic due to heavy bleeding. This has improved with the LnIUD. Pt is not trying to conceive at present but wants to maintain her fertility.        Gynecologic History Patient's last menstrual period was 01/05/2020. Contraception: IUD (Mirena) Last Pap: 09/05/2016. Results were: normal Last mammogram: n/a.   Obstetric History OB History  Gravida Para Term Preterm AB Living  0 0 0 0 0    SAB TAB Ectopic Multiple Live Births  0 0 0       The following portions of the patient's history were reviewed and updated as appropriate: allergies, current medications, past family history, past medical history, past social history, past surgical history and problem list.  Review of Systems Pertinent items are noted in HPI.    Objective:  BP (!) 151/89   Pulse 72   Ht 5\' 7"  (1.702 m)   Wt 169 lb 0.6 oz (76.7 kg)   LMP 01/05/2020   BMI 26.48 kg/m  General Appearance:    Alert, cooperative, no distress, appears stated age  Head:    Normocephalic, without obvious abnormality, atraumatic  Eyes:    conjunctiva/corneas clear, EOM's intact, both eyes  Ears:    Normal external ear canals, both ears  Nose:   Nares normal, septum midline, mucosa normal, no drainage    or sinus tenderness  Throat:   Lips, mucosa, and tongue normal; teeth and gums normal  Neck:   Supple, symmetrical, trachea midline, no adenopathy;    thyroid:  no enlargement/tenderness/nodules  Back:     Symmetric, no curvature, ROM normal, no CVA tenderness  Lungs:     respirations unlabored  Chest Wall:    No tenderness or deformity   Heart:    Regular rate and rhythm  Breast Exam:     No tenderness, masses, or nipple abnormality  Abdomen:     Soft, non-tender, bowel sounds active all four quadrants,    no masses, no organomegaly  Genitalia:    Normal female without lesion, discharge or tenderness   Uterus: Enlarged 22-23 weeks sized.  The uterus is mobile. There are palpable fibroids extending posteriorly pressing on the rectum.     Extremities:   Extremities normal, atraumatic, no cyanosis or edema  Pulses:   2+ and symmetric all extremities  Skin:   Skin color, texture, turgor normal, no rashes or lesions    Assessment:    Healthy female exam.   23 week sized symptomatic uterine fibroids. Reviewed with pt the potential risks of fibroids in pregnancy. I have also reviewed the treatment options for large fibroids in a person who wants to maintain fertility.   Anemia- resolved with LnIUD   Plan:   F/u PAP with hr HPV Reviewed with pt the treatment options for fibroids. All of her questions were answered. She wants to think about it and follow back up with me re her decision.  F/u in 1 year for annual Pt may stop the iron supplementation for now.    Yanni Quiroa L. Harraway-Smith, M.D., [facility]nn June

## 2020-01-13 NOTE — Patient Instructions (Signed)
Myomectomy, Care After This sheet gives you information about how to care for yourself after your procedure. Your health care provider may also give you more specific instructions. If you have problems or questions, contact your health care provider. What can I expect after the procedure? After the procedure, it is common to have:  Pain in your abdomen, especially at the incision areas. You will be given pain medicine to control the pain.  Tiredness. This is a normal part of the recovery process. Your energy level will return to normal over the coming weeks.  Vaginal bleeding. This is normal and will stop in the coming weeks.  Constipation. Recovery time from this procedure will depend on the type of procedure you had and your general overall health prior to the procedure. Follow these instructions at home: Medicines  Take over-the-counter and prescription medicines only as told by your health care provider.  Do not take aspirin because it can cause bleeding.  If you were prescribed an antibiotic medicine, use it as told by your health care provider. Do not stop using the antibiotic even if you start to feel better.  Do not drive or use heavy machinery while taking prescription pain medicine.  Do not drink alcohol while taking prescription pain medicine. Incision care   Follow instructions from your health care provider about how to take care of any incisions. Make sure you: ? Wash your hands with soap and water before you change your bandage (dressing). If soap and water are not available, use hand sanitizer. ? Change your dressing as told by your health care provider. ? Leave stitches (sutures), skin glue, or adhesive strips in place. These skin closures may need to stay in place for 2 weeks or longer. If adhesive strip edges start to loosen and curl up, you may trim the loose edges. Do not remove adhesive strips completely unless your health care provider tells you to do  that.  Check your incision areas every day for signs of infection. Check for: ? Redness, swelling, or pain. ? Fluid or blood. ? Warmth. ? Pus or a bad smell.  Do not take baths, swim, or use a hot tub until your health care provider approves. Take showers as directed by your health care provider. Activity  Return to your normal activities as told by your health care provider. Ask your health care provider what activities are safe for you.  Do not do activities that require a lot of effort until your health care provider says it is okay.  Do not lift anything that is heavier than 15 lb (6.8 kg) until your health care provider says that it is safe.  Do not douche, use tampons, or have sexual intercourse until your health care provider approves.  Walk daily but take frequent rest breaks if you tire easily.  Continue to practice deep breathing and coughing. If it hurts to cough, try holding a pillow against your belly as you cough.  Do not drive until your health care provider approves. General instructions  To prevent or treat constipation while you are taking prescription pain medicine, your health care provider may recommend that you: ? Drink enough fluid to keep your urine clear or pale yellow. ? Take over-the-counter or prescription medicines. ? Eat foods that are high in fiber, such as fresh fruits and vegetables, whole grains, and beans. ? Limit foods that are high in fat and processed sugars, such as fried and sweet foods.  Take your temperature twice a day  and write it down. If you develop a fever, this may be a sign that you have an infection.  Do not drink alcohol.  Have someone help you at home for 1 week or until you can do your own household activities.  Keep all follow-up visits as told by your health care provider. This is important. Contact a health care provider if:  You have a fever.  You have increasing abdominal pain that is not relieved with  medicine.  You have nausea, vomiting, or diarrhea.  You have pain when you urinate or you have blood in your urine.  You have a rash on your body.  You have pain or redness where your IV access tube was inserted.  You have redness, swelling, or pain around an incision.  You have fluid or blood coming from an incision.  An incision feels warm to the touch.  You have pus or a bad smell coming from an incision. Get help right away if:  You have weakness or light-headedness.  You have pain, swelling, or redness in your legs.  You have chest pain.  You faint.  You have shortness of breath.  You have heavy vaginal bleeding.  You have an incision that is opening up. Summary  Recovery time from this procedure will depend on the type of procedure you had and your general overall health prior to the procedure.  If you were prescribed an antibiotic medicine, use it as told by your health care provider. Do not stop using the antibiotic even if you start to feel better.  Do not douche, use tampons, or have sexual intercourse until your health care provider approves.  Return to your normal activities as told by your health care provider. Ask your health care provider what activities are safe for you. This information is not intended to replace advice given to you by your health care provider. Make sure you discuss any questions you have with your health care provider. Document Revised: 09/22/2017 Document Reviewed: 11/10/2016 Elsevier Patient Education  2020 [place] After This sheet gives you information about how to care for yourself after your procedure. Your health care provider may also give you more specific instructions. If you have problems or questions, contact your health care provider. What can I expect after the procedure? After the procedure, it is common to have:  Pain in your abdomen, especially at the incision areas. You will be given pain  medicine to control the pain.  Tiredness. This is a normal part of the recovery process. Your energy level will return to normal over the coming weeks.  Vaginal bleeding. This is normal and will stop in the coming weeks.  Constipation. Recovery time from this procedure will depend on the type of procedure you had and your general overall health prior to the procedure. Follow these instructions at home: Medicines  Take over-the-counter and prescription medicines only as told by your health care provider.  Do not take aspirin because it can cause bleeding.  If you were prescribed an antibiotic medicine, use it as told by your health care provider. Do not stop using the antibiotic even if you start to feel better.  Do not drive or use heavy machinery while taking prescription pain medicine.  Do not drink alcohol while taking prescription pain medicine. Incision care   Follow instructions from your health care provider about how to take care of any incisions. Make sure you: ? Wash your hands with soap and water before  you change your bandage (dressing). If soap and water are not available, use hand sanitizer. ? Change your dressing as told by your health care provider. ? Leave stitches (sutures), skin glue, or adhesive strips in place. These skin closures may need to stay in place for 2 weeks or longer. If adhesive strip edges start to loosen and curl up, you may trim the loose edges. Do not remove adhesive strips completely unless your health care provider tells you to do that.  Check your incision areas every day for signs of infection. Check for: ? Redness, swelling, or pain. ? Fluid or blood. ? Warmth. ? Pus or a bad smell.  Do not take baths, swim, or use a hot tub until your health care provider approves. Take showers as directed by your health care provider. Activity  Return to your normal activities as told by your health care provider. Ask your health care provider what  activities are safe for you.  Do not do activities that require a lot of effort until your health care provider says it is okay.  Do not lift anything that is heavier than 15 lb (6.8 kg) until your health care provider says that it is safe.  Do not douche, use tampons, or have sexual intercourse until your health care provider approves.  Walk daily but take frequent rest breaks if you tire easily.  Continue to practice deep breathing and coughing. If it hurts to cough, try holding a pillow against your belly as you cough.  Do not drive until your health care provider approves. General instructions  To prevent or treat constipation while you are taking prescription pain medicine, your health care provider may recommend that you: ? Drink enough fluid to keep your urine clear or pale yellow. ? Take over-the-counter or prescription medicines. ? Eat foods that are high in fiber, such as fresh fruits and vegetables, whole grains, and beans. ? Limit foods that are high in fat and processed sugars, such as fried and sweet foods.  Take your temperature twice a day and write it down. If you develop a fever, this may be a sign that you have an infection.  Do not drink alcohol.  Have someone help you at home for 1 week or until you can do your own household activities.  Keep all follow-up visits as told by your health care provider. This is important. Contact a health care provider if:  You have a fever.  You have increasing abdominal pain that is not relieved with medicine.  You have nausea, vomiting, or diarrhea.  You have pain when you urinate or you have blood in your urine.  You have a rash on your body.  You have pain or redness where your IV access tube was inserted.  You have redness, swelling, or pain around an incision.  You have fluid or blood coming from an incision.  An incision feels warm to the touch.  You have pus or a bad smell coming from an incision. Get help  right away if:  You have weakness or light-headedness.  You have pain, swelling, or redness in your legs.  You have chest pain.  You faint.  You have shortness of breath.  You have heavy vaginal bleeding.  You have an incision that is opening up. Summary  Recovery time from this procedure will depend on the type of procedure you had and your general overall health prior to the procedure.  If you were prescribed an antibiotic medicine, use  it as told by your health care provider. Do not stop using the antibiotic even if you start to feel better.  Do not douche, use tampons, or have sexual intercourse until your health care provider approves.  Return to your normal activities as told by your health care provider. Ask your health care provider what activities are safe for you. This information is not intended to replace advice given to you by your health care provider. Make sure you discuss any questions you have with your health care provider. Document Revised: 09/22/2017 Document Reviewed: 11/10/2016 Elsevier Patient Education  2020 [employer].

## 2020-01-14 LAB — CYTOLOGY - PAP
Comment: NEGATIVE
Diagnosis: NEGATIVE
High risk HPV: NEGATIVE

## 2020-02-28 ENCOUNTER — Inpatient Hospital Stay: Payer: 59

## 2020-03-03 ENCOUNTER — Other Ambulatory Visit: Payer: 59

## 2020-05-06 IMAGING — US US PELVIS COMPLETE
1 series · 15 of 25 positions shown · non-contrast
Comparison: 09/08/2015

CLINICAL DATA: Fibroids.  IUD string lost



[Series 1: us pelvis complete · 51 acquisitions, 15 frames shown]
[im 1/51]
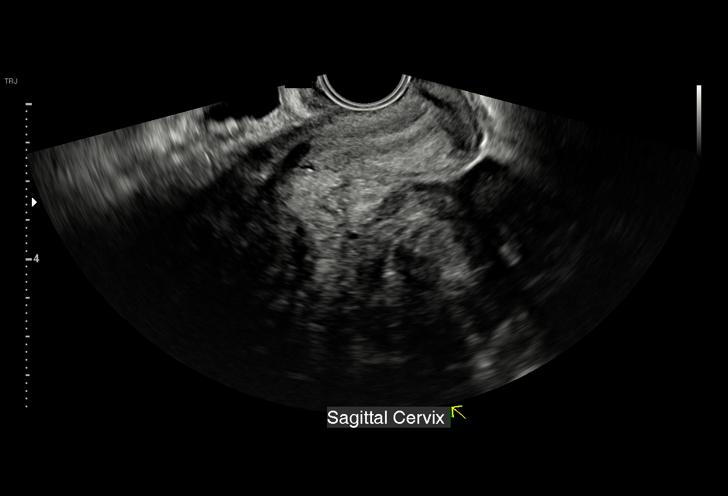
[im 5/51]
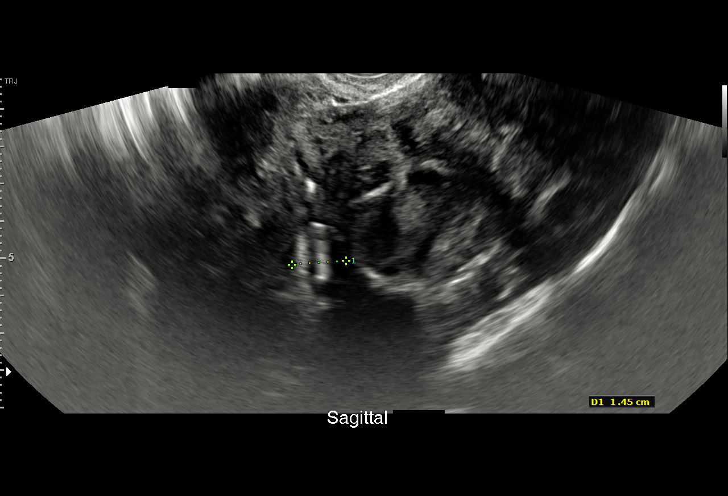
[im 9/51]
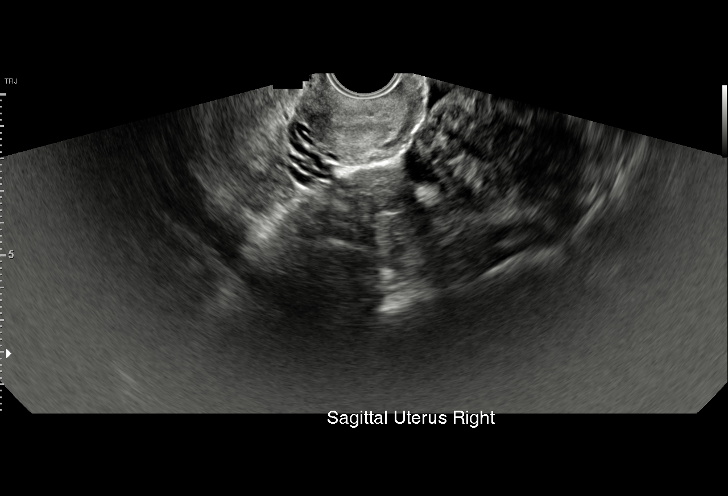
[im 11/51]
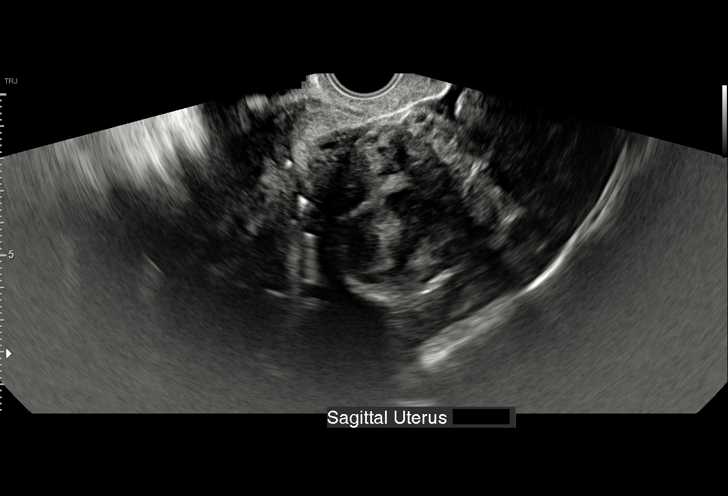
[im 15/51]
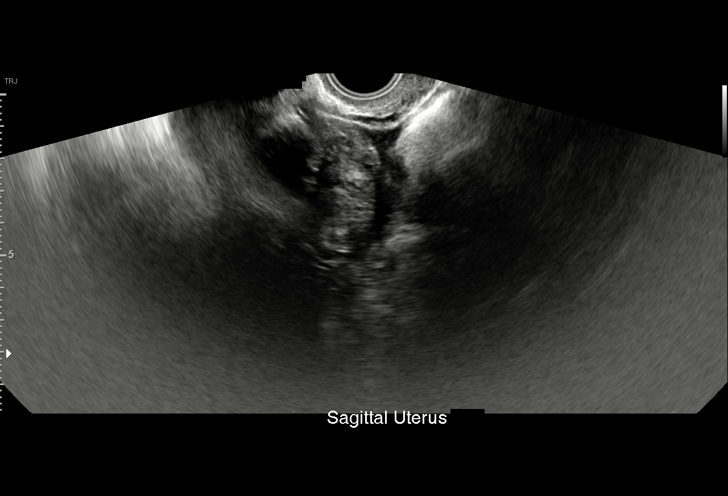
[im 19/51]
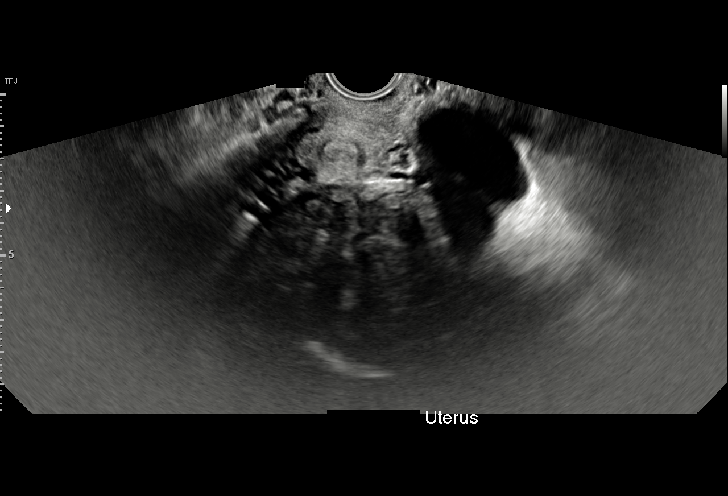
[im 21/51]
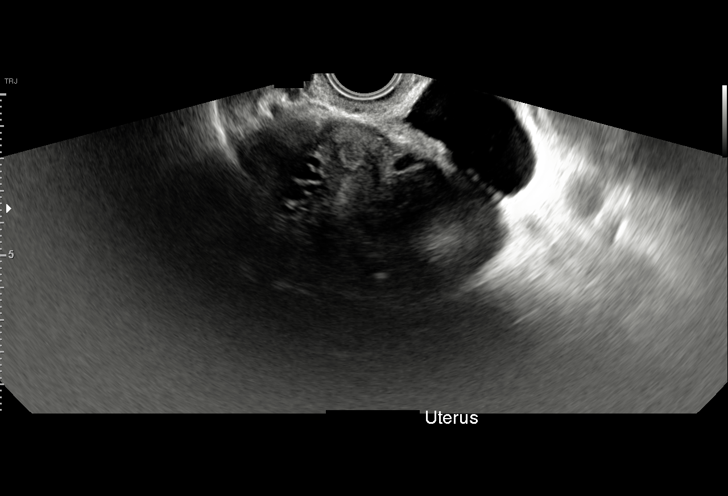
[im 26/51]
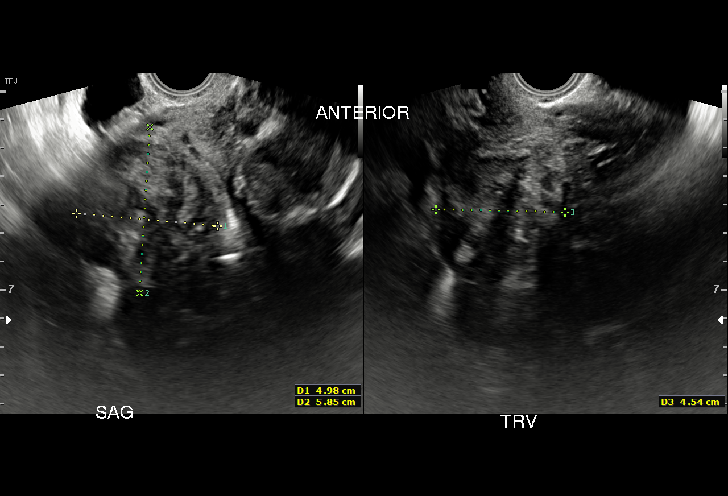
[im 30/51]
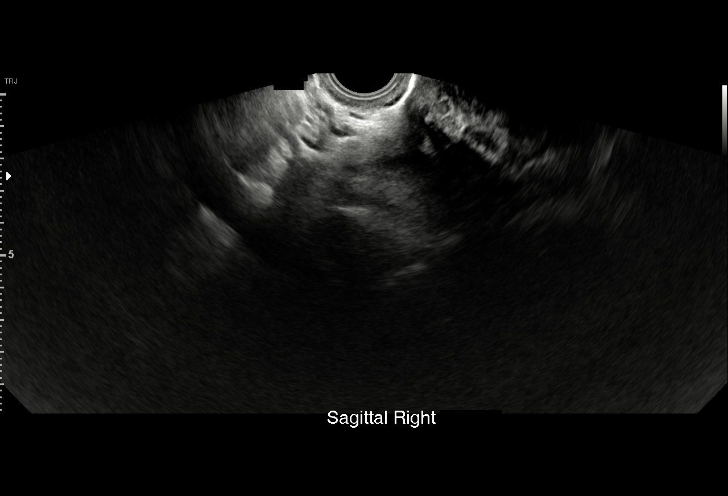
[im 32/51]
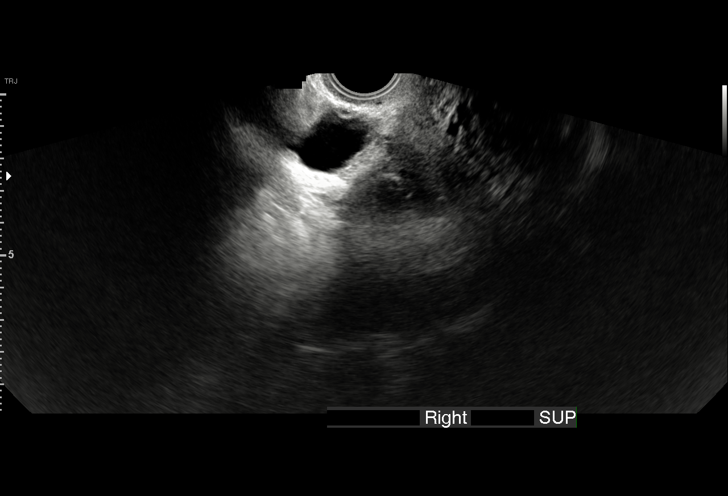
[im 36/51]
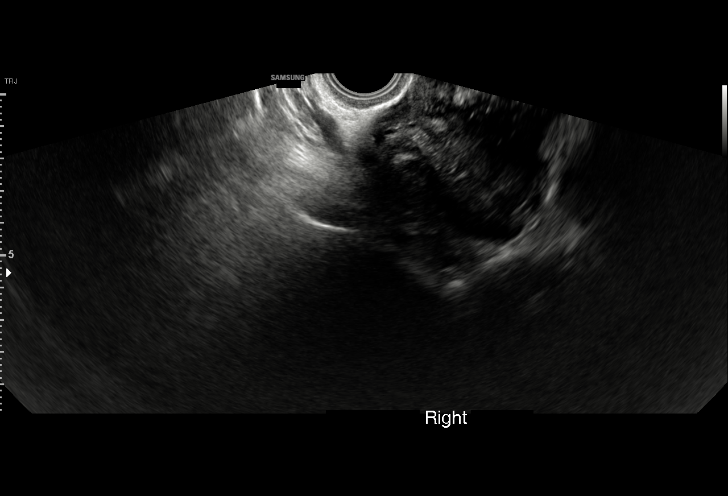
[im 40/51]
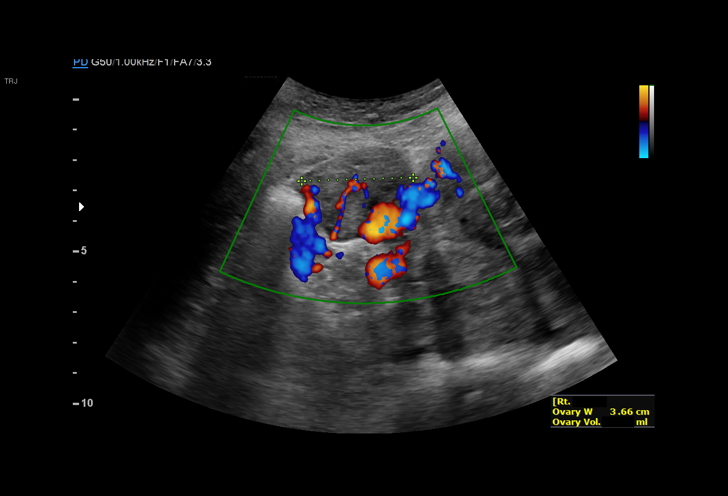
[im 42/51]
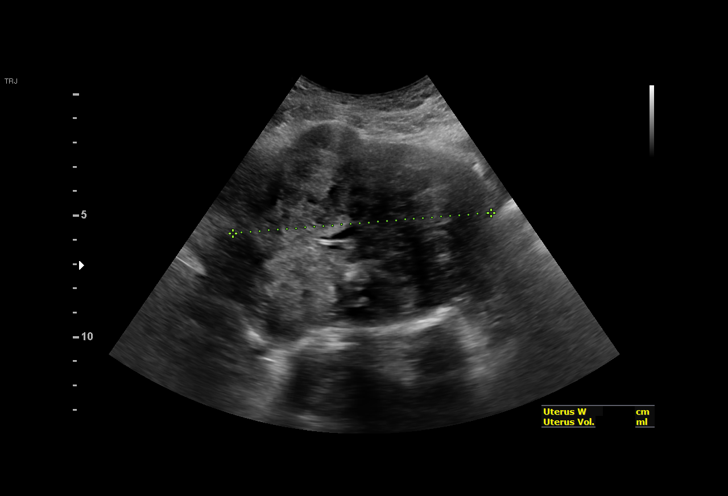
[im 46/51]
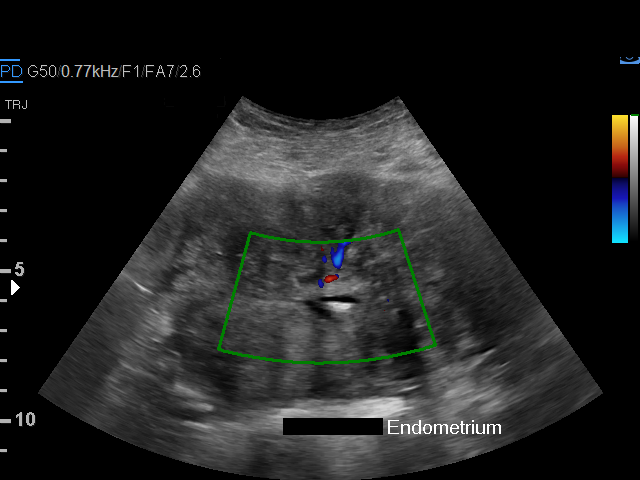
[im 51/51]
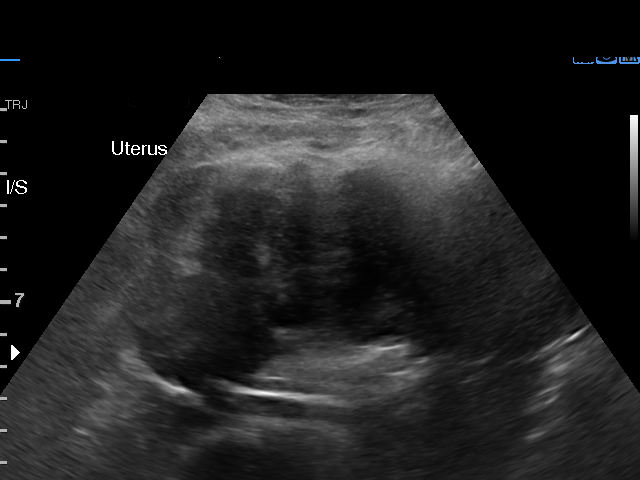

[15 of 25 positions shown; findings below may reference images not displayed]

FINDINGS: Uterus

Measurements: 15.7 x 8.1 x 10.7 cm. Multiple large solid masses
throughout the uterus, the largest an anterior subserosal fibroid
measuring up to 5.9 cm. Adjacent anterior left fundal fibroid
measures up to 5.6 cm.

Endometrium

Thickness: 4 mm in thickness. Fluid noted within the endometrial
canal. IUD is noted within the endometrial canal..

Right ovary

Measurements: 4.4 x 3.4 x 3.7 cm. Normal appearance/no adnexal mass.

Left ovary

Measurements: 3.1 x 2.3 x 1.9 cm. Normal appearance/no adnexal mass.

Other findings

No abnormal free fluid.
IMPRESSION: Enlarged fibroid uterus.

Fluid and IUD noted within the endometrial canal.

## 2020-05-19 DIAGNOSIS — H5213 Myopia, bilateral: Secondary | ICD-10-CM | POA: Diagnosis not present

## 2020-07-01 ENCOUNTER — Inpatient Hospital Stay: Payer: 59 | Attending: Oncology

## 2020-07-02 ENCOUNTER — Inpatient Hospital Stay: Payer: 59 | Admitting: Oncology

## 2020-07-02 ENCOUNTER — Telehealth: Payer: Self-pay | Admitting: Oncology

## 2020-07-02 ENCOUNTER — Other Ambulatory Visit: Payer: Self-pay

## 2020-07-02 NOTE — Telephone Encounter (Signed)
Patient informed co-worker that she did not remember her appts and did not complete lab. Lab and video visit needed to be rescheduled. Writer spoke with patient and rescheduled lab and video visit to October, per patient's request.

## 2020-08-03 ENCOUNTER — Inpatient Hospital Stay: Payer: 59 | Attending: Oncology

## 2020-08-03 ENCOUNTER — Other Ambulatory Visit: Payer: Self-pay

## 2020-08-03 DIAGNOSIS — D509 Iron deficiency anemia, unspecified: Secondary | ICD-10-CM | POA: Diagnosis not present

## 2020-08-03 LAB — FERRITIN: Ferritin: 29 ng/mL (ref 11–307)

## 2020-08-03 LAB — CBC WITH DIFFERENTIAL/PLATELET
Abs Immature Granulocytes: 0.01 10*3/uL (ref 0.00–0.07)
Basophils Absolute: 0 10*3/uL (ref 0.0–0.1)
Basophils Relative: 0 %
Eosinophils Absolute: 0.1 10*3/uL (ref 0.0–0.5)
Eosinophils Relative: 2 %
HCT: 40.7 % (ref 36.0–46.0)
Hemoglobin: 13 g/dL (ref 12.0–15.0)
Immature Granulocytes: 0 %
Lymphocytes Relative: 26 %
Lymphs Abs: 1.2 10*3/uL (ref 0.7–4.0)
MCH: 23.5 pg — ABNORMAL LOW (ref 26.0–34.0)
MCHC: 31.9 g/dL (ref 30.0–36.0)
MCV: 73.5 fL — ABNORMAL LOW (ref 80.0–100.0)
Monocytes Absolute: 0.7 10*3/uL (ref 0.1–1.0)
Monocytes Relative: 15 %
Neutro Abs: 2.7 10*3/uL (ref 1.7–7.7)
Neutrophils Relative %: 57 %
Platelets: 226 10*3/uL (ref 150–400)
RBC: 5.54 MIL/uL — ABNORMAL HIGH (ref 3.87–5.11)
RDW: 13.6 % (ref 11.5–15.5)
WBC: 4.8 10*3/uL (ref 4.0–10.5)
nRBC: 0 % (ref 0.0–0.2)

## 2020-08-03 LAB — IRON AND TIBC
Iron: 88 ug/dL (ref 28–170)
Saturation Ratios: 27 % (ref 10.4–31.8)
TIBC: 322 ug/dL (ref 250–450)
UIBC: 234 ug/dL

## 2020-08-04 ENCOUNTER — Inpatient Hospital Stay (HOSPITAL_BASED_OUTPATIENT_CLINIC_OR_DEPARTMENT_OTHER): Payer: 59 | Admitting: Oncology

## 2020-08-04 ENCOUNTER — Encounter: Payer: Self-pay | Admitting: Oncology

## 2020-08-04 DIAGNOSIS — D509 Iron deficiency anemia, unspecified: Secondary | ICD-10-CM | POA: Diagnosis not present

## 2020-08-04 NOTE — Progress Notes (Signed)
Pt feels much better, she had good energy. No concerns

## 2020-08-09 NOTE — Progress Notes (Signed)
I connected with Alicia Larson on 08/09/20 at  2:15 PM EDT by video enabled telemedicine visit and verified that I am speaking with the correct person using two identifiers.   I discussed the limitations, risks, security and privacy concerns of performing an evaluation and management service by telemedicine and the availability of in-person appointments. I also discussed with the patient that there may be a patient responsible charge related to this service. The patient expressed understanding and agreed to proceed.  Other persons participating in the visit and their role in the encounter:  none  Patient's location:  car Provider's location:  work  Risk analyst Complaint: Routine follow-up of iron deficiency anemia  History of present illness: patient is a 34 year old African-American female with a past medical history significant for iron deficiency anemia and fibroids.Most recent blood work from 06/26/2019 showed white count of 4.7, H&H of 11.5/41.3 with an MCV of 72 and a platelet count of 380. Iron study showed a low ferritin of 11 and low iron saturation of 9%. B12 and folate were normal. Prior to that her hemoglobin was 8.7 with an MCV of 64 in April 2019. Patient does have large fibroids and has been following up with GYN. She does not report particularly heavy menstrual cycles.  Patient decided to proceed with trial of oral iron instead of receiving IV iron which has worked well for her.  Interval history reports feeling well and denies any complaints.  Energy levels are presently better.  Denies any bleeding in her stool or urine.   Review of Systems  Constitutional: Negative for chills, fever, malaise/fatigue and weight loss.  HENT: Negative for congestion, ear discharge and nosebleeds.   Eyes: Negative for blurred vision.  Respiratory: Negative for cough, hemoptysis, sputum production, shortness of breath and wheezing.   Cardiovascular: Negative for chest pain, palpitations,  orthopnea and claudication.  Gastrointestinal: Negative for abdominal pain, blood in stool, constipation, diarrhea, heartburn, melena, nausea and vomiting.  Genitourinary: Negative for dysuria, flank pain, frequency, hematuria and urgency.  Musculoskeletal: Negative for back pain, joint pain and myalgias.  Skin: Negative for rash.  Neurological: Negative for dizziness, tingling, focal weakness, seizures, weakness and headaches.  Endo/Heme/Allergies: Does not bruise/bleed easily.  Psychiatric/Behavioral: Negative for depression and suicidal ideas. The patient does not have insomnia.     No Known Allergies  Past Medical History:  Diagnosis Date  . Anemia   . Dermatitis 05/16/2013   02/28/2013. [place]. [place] [facility].  Dermatitis-Nummular (arms,legs,buttocks) Treatment Plan: Triamincinolone 0.1% Cream apply twice daily. Tinea versicolor (back x) Treatment Plan: Selenium Sulfide Wash twice weekly. Ketoconazole Cream aaa bid x 3 wks.   . Elevated blood protein 01/25/2018  . Fibroids 09/16/2015  . Menorrhagia 09/03/2015    Past Surgical History:  Procedure Laterality Date  . WISDOM TOOTH EXTRACTION  2008    Social History   Socioeconomic History  . Marital status: Divorced    Spouse name: Not on file  . Number of children: Not on file  . Years of education: 44  . Highest education level: Bachelor's degree (e.g., BA, AB, BS)  Occupational History    Comment: Nurse  Tobacco Use  . Smoking status: Never Smoker  . Smokeless tobacco: Never Used  Vaping Use  . Vaping Use: Never used  Substance and Sexual Activity  . Alcohol use: Yes    Alcohol/week: 1.0 standard drink    Types: 1 Shots of liquor per week    Comment: ocassionally (3-4  x a month)  . Drug use: No  . Sexual activity: Yes    Partners: Male    Birth control/protection: I.U.D.  Other Topics Concern  . Not on file  Social History Narrative  . Not on file   Social Determinants of  Health   Financial Resource Strain:   . Difficulty of Paying Living Expenses: Not on file  Food Insecurity:   . Worried About Charity fundraiser in the Last Year: Not on file  . Ran Out of Food in the Last Year: Not on file  Transportation Needs:   . Lack of Transportation (Medical): Not on file  . Lack of Transportation (Non-Medical): Not on file  Physical Activity:   . Days of Exercise per Week: Not on file  . Minutes of Exercise per Session: Not on file  Stress:   . Feeling of Stress : Not on file  Social Connections:   . Frequency of Communication with Friends and Family: Not on file  . Frequency of Social Gatherings with Friends and Family: Not on file  . Attends Religious Services: Not on file  . Active Member of Clubs or Organizations: Not on file  . Attends Archivist Meetings: Not on file  . Marital Status: Not on file  Intimate Partner Violence:   . Fear of Current or Ex-Partner: Not on file  . Emotionally Abused: Not on file  . Physically Abused: Not on file  . Sexually Abused: Not on file    Family History  Problem Relation Age of Onset  . Diabetes Mother   . Hypertension Mother   . Hypertension Maternal Grandmother   . Hyperlipidemia Maternal Grandfather   . Hypertension Maternal Grandfather   . Heart disease Maternal Grandfather   . Leukemia Maternal Uncle      Current Outpatient Medications:  .  cholecalciferol (VITAMIN D) 1000 units tablet, Take 1,000 Units by mouth daily., Disp: , Rfl:  .  ferrous sulfate 325 (65 FE) MG tablet, Take 325 mg by mouth every other day. , Disp: , Rfl:  .  Multiple Vitamin (MULTIVITAMIN) tablet, Take 1 tablet by mouth daily., Disp: , Rfl:  .  ibuprofen (ADVIL,MOTRIN) 800 MG tablet, Take 1 tablet (800 mg total) by mouth 3 (three) times daily with meals as needed for headache or moderate pain (menorrhagia). (Patient not taking: Reported on 01/13/2020), Disp: 30 tablet, Rfl: 5  No results found.  No images are  attached to the encounter.   CMP Latest Ref Rng & Units 06/26/2019  Glucose 65 - 99 mg/dL 83  BUN 7 - 25 mg/dL 11  Creatinine 0.50 - 1.10 mg/dL 0.82  Sodium 135 - 146 mmol/L 140  Potassium 3.5 - 5.3 mmol/L 4.2  Chloride 98 - 110 mmol/L 105  CO2 20 - 32 mmol/L 27  Calcium 8.6 - 10.2 mg/dL 9.7  Total Protein 6.1 - 8.1 g/dL 8.0  Total Bilirubin 0.2 - 1.2 mg/dL 0.3  Alkaline Phos 33 - 115 U/L -  AST 10 - 30 U/L 18  ALT 6 - 29 U/L 12   CBC Latest Ref Rng & Units 08/03/2020  WBC 4.0 - 10.5 K/uL 4.8  Hemoglobin 12.0 - 15.0 g/dL 13.0  Hematocrit 36 - 46 % 40.7  Platelets 150 - 400 K/uL 226     Observation/objective: Appears in no acute distress over video visit today.  Breathing is nonlabored  Assessment and plan: Patient is a 34 year old female with history of iron deficiency anemia likely secondary  to fibroids  Patient is currently not anemic with a hemoglobin of 13 although she has mild microcytosis with an MCV of 73 and ferritin levels are low normal at 29.  However patient has responded well to oral iron with stable hemoglobin and has never required IV iron.  She can therefore follow-up with GYN and primary care provider Lysa therapy at this time.  If she becomes anemic that is not being controlled with oral iron she can be referred to [place] in the future  Follow-up instructions: No follow-up needed  I discussed the assessment and treatment plan with the patient. The patient was provided an opportunity to ask questions and all were answered. The patient agreed with the plan and demonstrated an understanding of the instructions.   The patient was advised to call back or seek an in-person evaluation if the symptoms worsen or if the condition fails to improve as anticipated.   Visit Diagnosis: 1. Iron deficiency anemia, unspecified iron deficiency anemia type     Dr. Randa Evens, MD, MPH [facility] at [facility] Tel- [phone] 08/09/2020 6:09 PM

## 2020-08-18 ENCOUNTER — Encounter: Payer: 59 | Admitting: Family Medicine

## 2021-03-09 ENCOUNTER — Other Ambulatory Visit: Payer: Self-pay

## 2021-03-09 ENCOUNTER — Encounter: Payer: Self-pay | Admitting: Family Medicine

## 2021-03-09 ENCOUNTER — Ambulatory Visit (INDEPENDENT_AMBULATORY_CARE_PROVIDER_SITE_OTHER): Payer: 59 | Admitting: Family Medicine

## 2021-03-09 VITALS — BP 142/86 | HR 97 | Temp 98.5°F | Resp 16 | Ht 67.0 in | Wt 171.3 lb

## 2021-03-09 DIAGNOSIS — Z0000 Encounter for general adult medical examination without abnormal findings: Secondary | ICD-10-CM

## 2021-03-09 DIAGNOSIS — Z1159 Encounter for screening for other viral diseases: Secondary | ICD-10-CM

## 2021-03-09 DIAGNOSIS — D509 Iron deficiency anemia, unspecified: Secondary | ICD-10-CM | POA: Diagnosis not present

## 2021-03-09 DIAGNOSIS — Z114 Encounter for screening for human immunodeficiency virus [HIV]: Secondary | ICD-10-CM

## 2021-03-09 DIAGNOSIS — I10 Essential (primary) hypertension: Secondary | ICD-10-CM | POA: Diagnosis not present

## 2021-03-09 DIAGNOSIS — Z130 Encounter for screening for diseases of the blood and blood-forming organs and certain disorders involving the immune mechanism: Secondary | ICD-10-CM

## 2021-03-09 DIAGNOSIS — E559 Vitamin D deficiency, unspecified: Secondary | ICD-10-CM | POA: Diagnosis not present

## 2021-03-09 DIAGNOSIS — Z13220 Encounter for screening for lipoid disorders: Secondary | ICD-10-CM

## 2021-03-09 MED ORDER — LOSARTAN POTASSIUM 25 MG PO TABS: 25.0000 mg | ORAL_TABLET | Freq: Every day | ORAL | 1 refills | Status: DC

## 2021-03-09 NOTE — Patient Instructions (Addendum)
Please call and follow up with Dr. Janese Banks for anemia  We will need to start medications to control your blood pressure if it continues to be >130/80 - newer guidelines recommend treatment if over 120/70  Please monitor at home, start meds if on average elevated and f/up for recheck in 2 weeks with nurse visit/BP check  Blood Pressure Record Sheet  Blood pressure log Date: _______________________  a.m. _____________________(1st reading) _____________________(2nd reading)  p.m. _____________________(1st reading) _____________________(2nd reading) Date: _______________________  a.m. _____________________(1st reading) _____________________(2nd reading)  p.m. _____________________(1st reading) _____________________(2nd reading) Date: _______________________  a.m. _____________________(1st reading) _____________________(2nd reading)  p.m. _____________________(1st reading) _____________________(2nd reading) Date: _______________________  a.m. _____________________(1st reading) _____________________(2nd reading)  p.m. _____________________(1st reading) _____________________(2nd reading) Date: _______________________  a.m. _____________________(1st reading) _____________________(2nd reading)  p.m. _____________________(1st reading) _____________________(2nd reading) Date: _______________________  a.m. _____________________(1st reading) _____________________(2nd reading)  p.m. _____________________(1st reading) _____________________(2nd reading) Date: _______________________  a.m. _____________________(1st reading) _____________________(2nd reading)  p.m. _____________________(1st reading) _____________________(2nd reading) Date: _______________________  a.m. _____________________(1st reading) _____________________(2nd reading)  p.m. _____________________(1st reading) _____________________(2nd reading) Date: _______________________  a.m. _____________________(1st reading)  _____________________(2nd reading)  p.m. _____________________(1st reading) _____________________(2nd reading) Date: _______________________  a.m. _____________________(1st reading) _____________________(2nd reading)  p.m. _____________________(1st reading) _____________________(2nd reading)   How to Take Your Blood Pressure Blood pressure measures how strongly your blood is pressing against the walls of your arteries. Arteries are blood vessels that carry blood from your heart throughout your body. You can take your blood pressure at home with a machine. You may need to check your blood pressure at home:  To check if you have high blood pressure (hypertension).  To check your blood pressure over time.  To make sure your blood pressure medicine is working. Supplies needed:  Blood pressure machine, or monitor.  Dining room chair to sit in.  Table or desk.  Small notebook.  Pencil or pen. How to prepare Avoid these things for 30 minutes before checking your blood pressure:  Having drinks with caffeine in them, such as coffee or tea.  Drinking alcohol.  Eating.  Smoking.  Exercising. Do these things five minutes before checking your blood pressure:  Go to the bathroom and pee (urinate).  Sit in a dining chair. Do not sit in a soft couch or an armchair.  Be quiet. Do not talk. How to take your blood pressure Follow the instructions that came with your machine. If you have a digital blood pressure monitor, these may be the instructions: 1. Sit up straight. 2. Place your feet on the floor. Do not cross your ankles or legs. 3. Rest your left arm at the level of your heart. You may rest it on a table, desk, or chair. 4. Pull up your shirt sleeve. 5. Wrap the blood pressure cuff around the upper part of your left arm. The cuff should be 1 inch (2.5 cm) above your elbow. It is best to wrap the cuff around bare skin. 6. Fit the cuff snugly around your arm. You should be  able to place only one finger between the cuff and your arm. 7. Place the cord so that it rests in the bend of your elbow. 8. Press the power button. 9. Sit quietly while the cuff fills with air and loses air. 10. Write down the numbers on the screen. 11. Wait 2-3 minutes and then repeat steps 1-10.   What do the numbers mean? Two numbers make up your blood pressure. The first number is called systolic pressure. The second is called diastolic pressure.  An example of a blood pressure reading is "120 over 80" (or 120/80). If you are an adult and do not have a medical condition, use this guide to find out if your blood pressure is normal: Normal  First number: below 120.  Second number: below 80. Elevated  First number: 120-129.  Second number: below 80. Hypertension stage 1  First number: 130-139.  Second number: 80-89. Hypertension stage 2  First number: 140 or above.  Second number: 22 or above. Your blood pressure is above normal even if only the top or bottom number is above normal. Follow these instructions at home:  Check your blood pressure as often as your doctor tells you to.  Check your blood pressure at the same time every day.  Take your monitor to your next doctor's appointment. Your doctor will: ? Make sure you are using it correctly. ? Make sure it is working right.  Make sure you understand what your blood pressure numbers should be.  Tell your doctor if your medicine is causing side effects.  Keep all follow-up visits as told by your doctor. This is important. General tips:  You will need a blood pressure machine, or monitor. Your doctor can suggest a monitor. You can buy one at a drugstore or online. When choosing one: ? Choose one with an arm cuff. ? Choose one that wraps around your upper arm. Only one finger should fit between your arm and the cuff. ? Do not choose one that measures your blood pressure from your wrist or finger. Where to find more  information American Heart Association: www.heart.org Contact a doctor if:  Your blood pressure keeps being high. Get help right away if:  Your first blood pressure number is higher than 180.  Your second blood pressure number is higher than 120. Summary  Check your blood pressure at the same time every day.  Avoid caffeine, alcohol, smoking, and exercise for 30 minutes before checking your blood pressure.  Make sure you understand what your blood pressure numbers should be. This information is not intended to replace advice given to you by your health care provider. Make sure you discuss any questions you have with your health care provider. Document Revised: 10/04/2019 Document Reviewed: 10/04/2019 Elsevier Patient Education  2021 [place] 72-11 Years Old, Female Preventive care refers to lifestyle choices and visits with your health care provider that can promote health and wellness. This includes:  A yearly physical exam. This is also called an annual wellness visit.  Regular dental and eye exams.  Immunizations.  Screening for certain conditions.  Healthy lifestyle choices, such as: ? Eating a healthy diet. ? Getting regular exercise. ? Not using drugs or products that contain nicotine and tobacco. ? Limiting alcohol use. What can I expect for my preventive care visit? Physical exam Your health care provider may check your:  Height and weight. These may be used to calculate your BMI (body mass index). BMI is a measurement that tells if you are at a healthy weight.  Heart rate and blood pressure.  Body temperature.  Skin for abnormal spots. Counseling Your health care provider may ask you questions about your:  Past medical problems.  Family's medical history.  Alcohol, tobacco, and drug use.  Emotional well-being.  Home life and relationship well-being.  Sexual activity.  Diet, exercise, and sleep habits.  Work and work  Statistician.  Access to firearms.  Method of birth control.  Menstrual cycle.  Pregnancy history. What immunizations do I need? Vaccines are usually given at various ages, according to a schedule. Your health care provider will recommend vaccines for you based on your age, medical history, and lifestyle or other factors, such as travel or where you work.   What tests do I need? Blood tests  Lipid and cholesterol levels. These may be checked every 5 years starting at age 31.  Hepatitis C test.  Hepatitis B test. Screening  Diabetes screening. This is done by checking your blood sugar (glucose) after you have not eaten for a while (fasting).  STD (sexually transmitted disease) testing, if you are at risk.  BRCA-related cancer screening. This may be done if you have a family history of breast, ovarian, tubal, or peritoneal cancers.  Pelvic exam and Pap test. This may be done every 3 years starting at age 67. Starting at age 26, this may be done every 5 years if you have a Pap test in combination with an HPV test. Talk with your health care provider about your test results, treatment options, and if necessary, the need for more tests.   Follow these instructions at home: Eating and drinking  Eat a healthy diet that includes fresh fruits and vegetables, whole grains, lean protein, and low-fat dairy products.  Take vitamin and mineral supplements as recommended by your health care provider.  Do not drink alcohol if: ? Your health care provider tells you not to drink. ? You are pregnant, may be pregnant, or are planning to become pregnant.  If you drink alcohol: ? Limit how much you have to 0-1 drink a day. ? Be aware of how much alcohol is in your drink. In the [place] one drink equals one 12 oz bottle of beer (355 mL), one 5 oz glass of wine (148 mL), or one 1 oz glass of hard liquor (44 mL).   Lifestyle  Take daily care of your teeth and gums. Brush your teeth every morning  and night with fluoride toothpaste. Floss one time each day.  Stay active. Exercise for at least 30 minutes 5 or more days each week.  Do not use any products that contain nicotine or tobacco, such as cigarettes, e-cigarettes, and chewing tobacco. If you need help quitting, ask your health care provider.  Do not use drugs.  If you are sexually active, practice safe sex. Use a condom or other form of protection to prevent STIs (sexually transmitted infections).  If you do not wish to become pregnant, use a form of birth control. If you plan to become pregnant, see your health care provider for a prepregnancy visit.  Find healthy ways to cope with stress, such as: ? Meditation, yoga, or listening to music. ? Journaling. ? Talking to a trusted person. ? Spending time with friends and family. Safety  Always wear your seat belt while driving or riding in a vehicle.  Do not drive: ? If you have been drinking alcohol. Do not ride with someone who has been drinking. ? When you are tired or distracted. ? While texting.  Wear a helmet and other protective equipment during sports activities.  If you have firearms in your house, make sure you follow all gun safety procedures.  Seek help if you have been physically or sexually abused. What's next?  Go to your health care provider once a year for an annual wellness visit.  Ask your health care provider how often you should have your eyes and teeth checked.  Stay  up to date on all vaccines. This information is not intended to replace advice given to you by your health care provider. Make sure you discuss any questions you have with your health care provider. Document Revised: 06/07/2020 Document Reviewed: 06/21/2018 Elsevier Patient Education  2021 [employer].

## 2021-03-09 NOTE — Progress Notes (Signed)
Patient: Alicia Larson, Female    DOB: January 27, 1986, 35 y.o.   MRN: [id number] Delsa Grana, PA-C Visit Date: 03/09/2021  Today's Provider: Delsa Grana, PA-C   Chief Complaint  Patient presents with  . Annual Exam   Subjective:   Annual physical exam:  Alicia Larson is a 35 y.o. female who presents today for complete physical exam:  Exercise/Activity:  Not exercising or active, works and is in school Diet/nutrition:  poor diet Sleep: no concerns   Pt not seen for years - last appt here 06/2019, prior to that Dr. Sanda Klein Jan 2020  Hx of iron deficiency - referred to Janese Banks, but did not do labs and f/up appt was rescheduled Was trying to take a 325 mg iron supplement daily in the past, GI SE bothersome, vaginal/menstrual cycles Cycles vary - less bleeding than before, some monthly cycles duration varies - sometimes red blood 3d, dark brown blood 2 week cycles changes light pads 4x a day  Hemoglobin  Date Value Ref Range Status  08/03/2020 13.0 12.0 - 15.0 g/dL Final  10/30/2019 12.7 12.0 - 15.0 g/dL Final  06/26/2019 11.5 (L) 11.7 - 15.5 g/dL Final  01/24/2018 8.7 (L) 11.7 - 15.5 g/dL Final   Lab Results  Component Value Date   IRON 88 08/03/2020   TIBC 322 08/03/2020   FERRITIN 29 08/03/2020    BP elevated today, no hx of HTN, though reviewed chart and over the last year there have been multiple instances of HTN and strong family hx as well Repeated multiple times here today and still elevated BP Readings from Last 3 Encounters:  03/09/21 (!) 142/86  01/13/20 (!) 151/89  07/05/19 138/82   Wt Readings from Last 5 Encounters:  03/09/21 171 lb 4.8 oz (77.7 kg)  01/13/20 169 lb 0.6 oz (76.7 kg)  07/05/19 158 lb 14.4 oz (72.1 kg)  06/26/19 159 lb 14.4 oz (72.5 kg)  10/15/18 158 lb (71.7 kg)   BMI Readings from Last 5 Encounters:  03/09/21 26.83 kg/m  01/13/20 26.48 kg/m  07/05/19 24.89 kg/m  06/26/19 25.04 kg/m  10/15/18 24.75 kg/m   Last year did well  woman with OBGYN - PAP and HPV neg   USPSTF grade A and B recommendations - reviewed and addressed today  Depression:  Phq 9 completed today by patient, was reviewed by me with patient in the room PHQ score is good, pt feels well, no depression, dealing well with stress PHQ 2/9 Scores 03/09/2021 06/26/2019 01/24/2018  PHQ - 2 Score 0 0 0  PHQ- 9 Score 0 0 -   Depression screen [facility] 2/9 03/09/2021 06/26/2019 01/24/2018  Decreased Interest 0 0 0  Down, Depressed, Hopeless 0 0 0  PHQ - 2 Score 0 0 0  Altered sleeping 0 0 -  Tired, decreased energy 0 0 -  Change in appetite 0 0 -  Feeling bad or failure about yourself  0 0 -  Trouble concentrating 0 0 -  Moving slowly or fidgety/restless 0 0 -  Suicidal thoughts 0 0 -  PHQ-9 Score 0 0 -  Difficult doing work/chores Not difficult at all - -    Alcohol screening: [place] Office Visit from 03/09/2021 in [facility]  AUDIT-C Score 2      Immunizations and Health Maintenance: Health Maintenance  Topic Date Due  . Hepatitis C Screening  Never done  . COVID-19 Vaccine (3 - Booster for Pfizer series) 11/26/2020  . INFLUENZA VACCINE  05/24/2021  .  PAP SMEAR-Modifier  01/13/2023  . TETANUS/TDAP  05/16/2028  . HIV Screening  Completed  . HPV VACCINES  Aged Out     Hep C Screening: due  STD testing and prevention (HIV/chl/gon/syphilis):  see above, no additional testing desired by pt today- due for HIV screen  Intimate partner violence:  Neg screen   Sexual History/Pain during Intercourse: Sig other - no concerns with intercoarse   Menstrual History/LMP/Abnormal Bleeding: hx of heavy bleeding, IUD, managed by OBGYN Patient's last menstrual period was 02/01/2021.  Incontinence Symptoms: denies  Breast cancer:  Not due per age and hx Last Mammogram: *see HM list above BRCA gene screening: n/a  Cervical cancer screening: UTD Pt no family hx of cancers - breast, ovarian, uterine, colon:     Osteoporosis:    Discussion on osteoporosis per age, including high calcium and vitamin D supplementation, weight bearing exercises  Vit D def - not taking meds, last Vit D was 1 and prior to that 10  Skin cancer:  Hx of skin CA -  NO Discussed atypical lesions   Colorectal cancer:   Colonoscopy is not due per age - hemoccult cards have been neg in the past   Discussed concerning signs and sx of CRC, pt denies red flags  Lung cancer:   Low Dose CT Chest recommended if Age 63-80 years, 30 pack-year currently smoking OR have quit w/in 15years. Patient does not qualify.    Social History   Tobacco Use  . Smoking status: Never Smoker  . Smokeless tobacco: Never Used  Vaping Use  . Vaping Use: Never used  Substance Use Topics  . Alcohol use: Yes    Alcohol/week: 1.0 standard drink    Types: 1 Shots of liquor per week    Comment: ocassionally (3-4 x a month)  . Drug use: No     Flowsheet Row Office Visit from 06/26/[facility] in [facility]  AUDIT-C Score 1      Family History  Problem Relation Age of Onset  . Diabetes Mother   . Hypertension Mother   . Hypertension Maternal Grandmother   . Hyperlipidemia Maternal Grandfather   . Hypertension Maternal Grandfather   . Heart disease Maternal Grandfather   . Leukemia Maternal Uncle      Blood pressure/Hypertension: BP Readings from Last 3 Encounters:  03/09/21 (!) 142/86  01/13/20 (!) 151/89  07/05/19 138/82    Weight/Obesity: Wt Readings from Last 3 Encounters:  03/09/21 171 lb 4.8 oz (77.7 kg)  01/13/20 169 lb 0.6 oz (76.7 kg)  07/05/19 158 lb 14.4 oz (72.1 kg)   BMI Readings from Last 3 Encounters:  03/09/21 26.83 kg/m  01/13/20 26.48 kg/m  07/05/19 24.89 kg/m     Lipids:  Lab Results  Component Value Date   CHOL 156 06/26/2019   CHOL 160 01/24/2018   CHOL 154 09/05/2016   Lab Results  Component Value Date   HDL 58 06/26/2019   HDL 60 01/24/2018   HDL 69 09/05/2016   Lab Results  Component  Value Date   LDLCALC 85 06/26/2019   LDLCALC 89 01/24/2018   LDLCALC 77 09/05/2016   Lab Results  Component Value Date   TRIG 58 06/26/2019   TRIG 36 01/24/2018   TRIG 40 09/05/2016   Lab Results  Component Value Date   CHOLHDL 2.7 06/26/2019   CHOLHDL 2.7 01/24/2018   CHOLHDL 2.2 09/05/2016   No results found for: LDLDIRECT Based on the results of lipid panel his/her  cardiovascular risk factor ( using [facility] )  in the next 10 years is: The ASCVD Risk score Mikey Bussing DC Jr., et al., 2013) failed to calculate for the following reasons:   The 2013 ASCVD risk score is only valid for ages 67 to 79 Glucose:  Glucose, Bld  Date Value Ref Range Status  06/26/2019 83 65 - 99 mg/dL Final    Comment:    .            Fasting reference interval .   01/24/2018 76 65 - 99 mg/dL Final    Comment:    .            Fasting reference interval .   09/05/2016 66 65 - 99 mg/dL Final     Advanced Care Planning:  A voluntary discussion about advance care planning including the explanation and discussion of advance directives.   Discussed health care proxy and Living will, and the patient was able to identify a health care proxy as mom - Dareen Piano.   Patient does not have a living will at present time.   Social History      She        Social History   Socioeconomic History  . Marital status: Divorced    Spouse name: Not on file  . Number of children: Not on file  . Years of education: 107  . Highest education level: Bachelor's degree (e.g., BA, AB, BS)  Occupational History    Comment: Nurse  Tobacco Use  . Smoking status: Never Smoker  . Smokeless tobacco: Never Used  Vaping Use  . Vaping Use: Never used  Substance and Sexual Activity  . Alcohol use: Yes    Alcohol/week: 1.0 standard drink    Types: 1 Shots of liquor per week    Comment: ocassionally (3-4 x a month)  . Drug use: No  . Sexual activity: Yes    Partners: Male    Birth control/protection: I.U.D.   Other Topics Concern  . Not on file  Social History Narrative  . Not on file   Social Determinants of Health   Financial Resource Strain: Low Risk   . Difficulty of Paying Living Expenses: Not hard at all  Food Insecurity: No Food Insecurity  . Worried About Charity fundraiser in the Last Year: Never true  . Ran Out of Food in the Last Year: Never true  Transportation Needs: No Transportation Needs  . Lack of Transportation (Medical): No  . Lack of Transportation (Non-Medical): No  Physical Activity: Inactive  . Days of Exercise per Week: 0 days  . Minutes of Exercise per Session: 0 min  Stress: No Stress Concern Present  . Feeling of Stress : Only a little  Social Connections: Moderately Integrated  . Frequency of Communication with Friends and Family: More than three times a week  . Frequency of Social Gatherings with Friends and Family: Once a week  . Attends Religious Services: 1 to 4 times per year  . Active Member of Clubs or Organizations: No  . Attends Archivist Meetings: Never  . Marital Status: Living with partner    Family History        Family History  Problem Relation Age of Onset  . Diabetes Mother   . Hypertension Mother   . Hypertension Maternal Grandmother   . Hyperlipidemia Maternal Grandfather   . Hypertension Maternal Grandfather   . Heart disease Maternal Grandfather   . Leukemia Maternal Uncle  Patient Active Problem List   Diagnosis Date Noted  . Iron deficiency anemia 01/25/2018  . Fibroids 09/16/2015  . Contraception, device intrauterine 03/29/2012    Past Surgical History:  Procedure Laterality Date  . WISDOM TOOTH EXTRACTION  2008    No current outpatient medications on file.  No Known Allergies  Patient Care Team: Delsa Grana, PA-C as PCP - General (Family Medicine)  Review of Systems  Constitutional: Negative.  Negative for activity change, appetite change, fatigue and unexpected weight change.  HENT:  Negative.   Eyes: Negative.   Respiratory: Negative.  Negative for shortness of breath.   Cardiovascular: Negative.  Negative for chest pain, palpitations and leg swelling.  Gastrointestinal: Negative.  Negative for abdominal pain and blood in stool.  Endocrine: Negative.   Genitourinary: Negative.   Musculoskeletal: Negative.  Negative for arthralgias, gait problem, joint swelling and myalgias.  Skin: Negative.  Negative for pallor and rash.  Allergic/Immunologic: Negative.   Neurological: Negative.  Negative for syncope, weakness, light-headedness, numbness and headaches.  Hematological: Negative.   Psychiatric/Behavioral: Negative.  Negative for dysphoric mood, self-injury and suicidal ideas. The patient is not nervous/anxious.   All other systems reviewed and are negative.     I personally reviewed active problem list, medication list, allergies, family history, social history, health maintenance, notes from last encounter, lab results, imaging with the patient/caregiver today.        Objective:   Vitals:  Vitals:   03/09/21 1025 03/09/21 1029 03/09/21 1119  BP: (!) 148/98 (!) 142/86 (!) 142/86  Pulse: 97    Resp: 16    Temp: 98.5 F (36.9 C)    SpO2: 99%    Weight: 171 lb 4.8 oz (77.7 kg)    Height: _0  (1.702 m)      Body mass index is 26.83 kg/m.  Physical Exam Vitals and nursing note reviewed.  Constitutional:      General: She is not in acute distress.    Appearance: Normal appearance. She is well-developed and normal weight. She is not ill-appearing, toxic-appearing or diaphoretic.     Interventions: Face mask in place.  HENT:     Head: Normocephalic and atraumatic.     Right Ear: External ear normal. There is no impacted cerumen.     Left Ear: External ear normal. There is no impacted cerumen.     Nose: Congestion present. No rhinorrhea.     Mouth/Throat:     Mouth: Mucous membranes are moist.     Pharynx: Oropharynx is clear. Posterior oropharyngeal  erythema present. No oropharyngeal exudate.  Eyes:     General: Lids are normal. No scleral icterus.       Right eye: No discharge.        Left eye: No discharge.     Conjunctiva/sclera: Conjunctivae normal.     Pupils: Pupils are equal, round, and reactive to light.  Neck:     Trachea: Phonation normal. No tracheal deviation.  Cardiovascular:     Rate and Rhythm: Normal rate and regular rhythm.     Pulses: Normal pulses.          Radial pulses are 2+ on the right side and 2+ on the left side.       Posterior tibial pulses are 2+ on the right side and 2+ on the left side.     Heart sounds: Normal heart sounds. No murmur heard. No friction rub. No gallop.   Pulmonary:     Effort: Pulmonary effort  is normal. No respiratory distress.     Breath sounds: Normal breath sounds. No stridor. No wheezing, rhonchi or rales.  Chest:     Chest wall: No tenderness.  Abdominal:     General: Bowel sounds are normal. There is no distension.     Palpations: Abdomen is soft.     Tenderness: There is no right CVA tenderness or left CVA tenderness.  Musculoskeletal:        General: Normal range of motion.     Cervical back: Normal range of motion.     Right lower leg: No edema.     Left lower leg: No edema.  Skin:    General: Skin is warm and dry.     Coloration: Skin is not jaundiced or pale.     Findings: No rash.     Comments: No pallor  Neurological:     Mental Status: She is alert. Mental status is at baseline.     Motor: No abnormal muscle tone.     Gait: Gait normal.  Psychiatric:        Mood and Affect: Mood normal.        Speech: Speech normal.        Behavior: Behavior normal.       Fall Risk: Fall Risk  03/09/2021 06/26/2019 01/24/2018  Falls in the past year? 0 0 No  Number falls in past yr: 0 0 -  Injury with Fall? 0 0 -    Functional Status Survey: Is the patient deaf or have difficulty hearing?: No Does the patient have difficulty seeing, even when wearing  glasses/contacts?: No Does the patient have difficulty concentrating, remembering, or making decisions?: No Does the patient have difficulty walking or climbing stairs?: No Does the patient have difficulty dressing or bathing?: No Does the patient have difficulty doing errands alone such as visiting a doctor's office or shopping?: No   Assessment & Plan:    CPE completed today  . USPSTF grade A and B recommendations reviewed with patient; age-appropriate recommendations, preventive care, screening tests, etc discussed and encouraged; healthy living encouraged; see AVS for patient education given to patient  . Discussed importance of 150 minutes of physical activity weekly, AHA exercise recommendations given to pt in AVS/handout  . Discussed importance of healthy diet:  eating lean meats and proteins, avoiding trans fats and saturated fats, avoid simple sugars and excessive carbs in diet, eat 6 servings of fruit/vegetables daily and drink plenty of water and avoid sweet beverages.    . Recommended pt to do annual eye exam and routine dental exams/cleanings  . Depression, alcohol, fall screening completed as documented above and per flowsheets  . Reviewed Health Maintenance: Health Maintenance  Topic Date Due  . Hepatitis C Screening  Never done  . COVID-19 Vaccine (3 - Booster for Pfizer series) 11/26/2020  . INFLUENZA VACCINE  05/24/2021  . PAP SMEAR-Modifier  01/13/2023  . TETANUS/TDAP  05/16/2028  . HIV Screening  Completed  . HPV VACCINES  Aged Out    . Immunizations: Immunization History  Administered Date(s) Administered  . Influenza-Unspecified 07/25/2015, 07/18/2019  . PFIZER(Purple Top)SARS-COV-2 Vaccination 06/05/2020, 06/26/2020  . Tdap 10/24/2005, 05/16/2018      1. Annual physical exam - Hepatitis C Antibody - HIV antibody (with reflex) - COMPLETE METABOLIC PANEL WITH GFR - CBC w/Diff/Platelet - Lipid panel - TSH  2. Vitamin D deficiency Will likely need  high dose vit D supplement again, advised to try 5000 IU supplement after  Rx 1-2 x a week, since she is not good with pills, goal to get Vit D >20 - COMPLETE METABOLIC PANEL WITH GFR - VITAMIN D 25 Hydroxy (Vit-D Deficiency, Fractures)  3. Encounter for hepatitis C screening test for low risk patient  - Hepatitis C Antibody  4. Screening for HIV without presence of risk factors  - HIV antibody (with reflex)  5. Iron deficiency anemia, unspecified iron deficiency anemia type Off iron, menses a little better, recheck, no longer following at hematology - CBC w/Diff/Platelet - Iron, TIBC and Ferritin Panel  6. Hypertension, unspecified type Elevated readings since 12/2019 Strong family hx Some weight gain and poor diet/activity level over the past 2 years Monitor BP at home if >130/80 advised to start meds - will try losartan 25 mg Encouraged her to start going for walks, do low salt diet, 1 month f/up  - COMPLETE METABOLIC PANEL WITH GFR    Delsa Grana, PA-C 03/09/21 10:39 AM  [facility]

## 2021-03-10 LAB — IRON,TIBC AND FERRITIN PANEL
%SAT: 36 % (calc) (ref 16–45)
Ferritin: 13 ng/mL — ABNORMAL LOW (ref 16–154)
Iron: 135 ug/dL (ref 40–190)
TIBC: 377 mcg/dL (calc) (ref 250–450)

## 2021-03-10 LAB — COMPLETE METABOLIC PANEL WITH GFR
AG Ratio: 1.3 (calc) (ref 1.0–2.5)
ALT: 12 U/L (ref 6–29)
AST: 17 U/L (ref 10–30)
Albumin: 4.3 g/dL (ref 3.6–5.1)
Alkaline phosphatase (APISO): 78 U/L (ref 31–125)
BUN: 8 mg/dL (ref 7–25)
CO2: 27 mmol/L (ref 20–32)
Calcium: 9.4 mg/dL (ref 8.6–10.2)
Chloride: 106 mmol/L (ref 98–110)
Creat: 0.72 mg/dL (ref 0.50–1.10)
GFR, Est African American: 125 mL/min/{1.73_m2} (ref >=60)
GFR, Est Non African American: 108 mL/min/{1.73_m2} (ref >=60)
Globulin: 3.2 g/dL (calc) (ref 1.9–3.7)
Glucose, Bld: 76 mg/dL (ref 65–99)
Potassium: 4 mmol/L (ref 3.5–5.3)
Sodium: 139 mmol/L (ref 135–146)
Total Bilirubin: 0.5 mg/dL (ref 0.2–1.2)
Total Protein: 7.5 g/dL (ref 6.1–8.1)

## 2021-03-10 LAB — LIPID PANEL
Cholesterol: 161 mg/dL (ref <200)
HDL: 59 mg/dL (ref >=50)
LDL Cholesterol (Calc): 92 mg/dL (calc)
Non-HDL Cholesterol (Calc): 102 mg/dL (calc) (ref <130)
Total CHOL/HDL Ratio: 2.7 (calc) (ref <5.0)
Triglycerides: 35 mg/dL (ref <150)

## 2021-03-10 LAB — CBC WITH DIFFERENTIAL/PLATELET
Absolute Monocytes: 423 cells/uL (ref 200–950)
Basophils Absolute: 9 cells/uL (ref 0–200)
Basophils Relative: 0.2 %
Eosinophils Absolute: 18 cells/uL (ref 15–500)
Eosinophils Relative: 0.4 %
HCT: 41.4 % (ref 35.0–45.0)
Hemoglobin: 12.8 g/dL (ref 11.7–15.5)
Lymphs Abs: 1215 cells/uL (ref 850–3900)
MCH: 23.7 pg — ABNORMAL LOW (ref 27.0–33.0)
MCHC: 30.9 g/dL — ABNORMAL LOW (ref 32.0–36.0)
MCV: 76.5 fL — ABNORMAL LOW (ref 80.0–100.0)
MPV: 10.5 fL (ref 7.5–12.5)
Monocytes Relative: 9.4 %
Neutro Abs: 2835 cells/uL (ref 1500–7800)
Neutrophils Relative %: 63 %
Platelets: 240 10*3/uL (ref 140–400)
RBC: 5.41 10*6/uL — ABNORMAL HIGH (ref 3.80–5.10)
RDW: 13.3 % (ref 11.0–15.0)
Total Lymphocyte: 27 %
WBC: 4.5 10*3/uL (ref 3.8–10.8)

## 2021-03-10 LAB — HEPATITIS C ANTIBODY
Hepatitis C Ab: NONREACTIVE
SIGNAL TO CUT-OFF: 0.02 (ref <1.00)

## 2021-03-10 LAB — TSH: TSH: 0.77 mIU/L

## 2021-03-10 LAB — HIV ANTIBODY (ROUTINE TESTING W REFLEX): HIV 1&2 Ab, 4th Generation: NONREACTIVE

## 2021-03-10 LAB — VITAMIN D 25 HYDROXY (VIT D DEFICIENCY, FRACTURES): Vit D, 25-Hydroxy: 22 ng/mL — ABNORMAL LOW (ref 30–100)

## 2021-05-10 ENCOUNTER — Other Ambulatory Visit (HOSPITAL_COMMUNITY): Payer: Self-pay

## 2021-05-10 ENCOUNTER — Encounter: Payer: Self-pay | Admitting: Oncology

## 2021-05-10 MED ORDER — LOSARTAN POTASSIUM 25 MG PO TABS
25.0000 mg | ORAL_TABLET | Freq: Every day | ORAL | 0 refills | Status: DC
  Filled 2021-05-10: qty 90, 90d supply, fill #0
  Filled 2021-09-15: qty 30, 30d supply, fill #1
  Filled 2021-10-01: qty 60, 60d supply, fill #1

## 2021-05-14 ENCOUNTER — Other Ambulatory Visit: Payer: Self-pay

## 2021-05-14 ENCOUNTER — Ambulatory Visit: Payer: 59

## 2021-05-14 VITALS — BP 134/84 | HR 72

## 2021-05-14 DIAGNOSIS — I10 Essential (primary) hypertension: Secondary | ICD-10-CM

## 2021-05-14 NOTE — Progress Notes (Signed)
BP looked good, continue same dose, routine f/up 6 months from last OV to monitor and recheck labs thanks

## 2021-05-14 NOTE — Progress Notes (Signed)
Patient here for blood pressure check.  At last visit blood pressure was elevated and she was started on Losartan '25mg'$ .  Patient denies any side effects and blood pressure has been running good for her.  Blood pressure today was 132/84.  I told her it looked good and I would notifiy provider, continue same med and if any changes we would let her know.

## 2021-09-09 DIAGNOSIS — H5213 Myopia, bilateral: Secondary | ICD-10-CM | POA: Diagnosis not present

## 2021-09-15 ENCOUNTER — Other Ambulatory Visit (HOSPITAL_COMMUNITY): Payer: Self-pay

## 2021-09-23 ENCOUNTER — Other Ambulatory Visit (HOSPITAL_COMMUNITY): Payer: Self-pay

## 2021-09-28 DIAGNOSIS — Z111 Encounter for screening for respiratory tuberculosis: Secondary | ICD-10-CM | POA: Diagnosis not present

## 2021-10-01 ENCOUNTER — Other Ambulatory Visit (HOSPITAL_COMMUNITY): Payer: Self-pay

## 2021-10-01 DIAGNOSIS — Z111 Encounter for screening for respiratory tuberculosis: Secondary | ICD-10-CM | POA: Diagnosis not present

## 2021-12-22 ENCOUNTER — Other Ambulatory Visit: Payer: Self-pay | Admitting: Family Medicine

## 2021-12-22 ENCOUNTER — Other Ambulatory Visit (HOSPITAL_COMMUNITY): Payer: Self-pay

## 2021-12-22 MED ORDER — LOSARTAN POTASSIUM 25 MG PO TABS
25.0000 mg | ORAL_TABLET | Freq: Every day | ORAL | 0 refills | Status: DC
  Filled 2021-12-22: qty 30, 30d supply, fill #0

## 2021-12-22 NOTE — Telephone Encounter (Signed)
30 days given of bp med needs an appt ?

## 2022-02-06 ENCOUNTER — Other Ambulatory Visit: Payer: Self-pay | Admitting: Family Medicine

## 2022-02-07 ENCOUNTER — Other Ambulatory Visit (HOSPITAL_COMMUNITY): Payer: Self-pay

## 2022-02-07 MED ORDER — LOSARTAN POTASSIUM 25 MG PO TABS
25.0000 mg | ORAL_TABLET | Freq: Every day | ORAL | 0 refills | Status: DC
  Filled 2022-02-07: qty 30, 30d supply, fill #0

## 2022-02-10 ENCOUNTER — Other Ambulatory Visit (HOSPITAL_COMMUNITY): Payer: Self-pay

## 2022-03-15 ENCOUNTER — Encounter: Payer: Self-pay | Admitting: Family Medicine

## 2022-03-15 ENCOUNTER — Ambulatory Visit (INDEPENDENT_AMBULATORY_CARE_PROVIDER_SITE_OTHER): Payer: 59 | Admitting: Family Medicine

## 2022-03-15 ENCOUNTER — Other Ambulatory Visit (HOSPITAL_COMMUNITY): Payer: Self-pay

## 2022-03-15 VITALS — BP 128/84 | HR 80 | Temp 97.9°F | Resp 16 | Ht 67.0 in | Wt 173.2 lb

## 2022-03-15 DIAGNOSIS — I10 Essential (primary) hypertension: Secondary | ICD-10-CM

## 2022-03-15 DIAGNOSIS — Z0000 Encounter for general adult medical examination without abnormal findings: Secondary | ICD-10-CM

## 2022-03-15 DIAGNOSIS — D219 Benign neoplasm of connective and other soft tissue, unspecified: Secondary | ICD-10-CM

## 2022-03-15 DIAGNOSIS — R1900 Intra-abdominal and pelvic swelling, mass and lump, unspecified site: Secondary | ICD-10-CM

## 2022-03-15 DIAGNOSIS — R109 Unspecified abdominal pain: Secondary | ICD-10-CM

## 2022-03-15 DIAGNOSIS — E611 Iron deficiency: Secondary | ICD-10-CM | POA: Diagnosis not present

## 2022-03-15 DIAGNOSIS — E559 Vitamin D deficiency, unspecified: Secondary | ICD-10-CM | POA: Diagnosis not present

## 2022-03-15 MED ORDER — LOSARTAN POTASSIUM 25 MG PO TABS
25.0000 mg | ORAL_TABLET | Freq: Every day | ORAL | 3 refills | Status: DC
  Filled 2022-03-15: qty 90, 90d supply, fill #0
  Filled 2022-06-15: qty 90, 90d supply, fill #1

## 2022-03-15 NOTE — Progress Notes (Signed)
Patient: Alicia Larson, Female    DOB: 17-Jun-1986, 36 y.o.   MRN: [id number] Delsa Grana, PA-C Visit Date: 03/15/2022  Today's Provider: Delsa Grana, PA-C   Chief Complaint  Patient presents with   Annual Exam   Subjective:   Annual physical exam:  Alicia Larson is a 36 y.o. female who presents today for complete physical exam:  Exercise/Activity/Diet/nutrition:   not much efforts right now- very busy Sleep:  no concerns  SDOH Screenings   Alcohol Screen: Low Risk    Last Alcohol Screening Score (AUDIT): 1  Depression (PHQ2-9): Low Risk    PHQ-2 Score: 0  Financial Resource Strain: Low Risk    Difficulty of Paying Living Expenses: Not hard at all  Food Insecurity: No Food Insecurity   Worried About Charity fundraiser in the Last Year: Never true   Ran Out of Food in the Last Year: Never true  Housing: Low Risk    Last Housing Risk Score: 0  Physical Activity: Insufficiently Active   Days of Exercise per Week: 1 day   Minutes of Exercise per Session: 10 min  Social Connections: Moderately Integrated   Frequency of Communication with Friends and Family: More than three times a week   Frequency of Social Gatherings with Friends and Family: Once a week   Attends Religious Services: More than 4 times per year   Active Member of [employer] or Organizations: No   Attends Archivist Meetings: Never   Marital Status: Living with partner  Stress: No Stress Concern Present   Feeling of Stress : Only a little  Tobacco Use: Low Risk    Smoking Tobacco Use: Never   Smokeless Tobacco Use: Never   Passive Exposure: Not on file  Transportation Needs: No Transportation Needs   Lack of Transportation (Medical): No   Lack of Transportation (Non-Medical): No     Pt wished to discuss acute complaints and do routine f/up on chronic conditions today in addition to CPE. Advised pt of separate visit billing/coding  Pelvic mass/fibroids- better cycles with iud, iron  deficiency Mass seems likes its gotten larger, last GYN visit was over 2 years ago Right back pain Normal bowels and urination  Hypertension:  Currently managed on losartan Pt reports good med compliance and denies any SE.   Blood pressure today is well controlled. BP Readings from Last 3 Encounters:  03/15/22 128/84  05/14/21 134/84  03/09/21 (!) 142/86   Pt denies CP, SOB, exertional sx, LE edema, palpitation, Ha's, visual disturbances, lightheadedness, hypotension, syncope.   USPSTF grade A and B recommendations - reviewed and addressed today  Depression:  Phq 9 completed today by patient, was reviewed by me with patient in the room PHQ score is neg, pt feels good    03/15/2022   10:23 AM 03/09/2021   10:26 AM 06/26/2019    8:38 AM 01/24/2018   10:44 AM  PHQ 2/9 Scores  PHQ - 2 Score 0 0 0 0  PHQ- 9 Score 0 0 0       03/15/2022   10:23 AM 03/09/2021   10:26 AM 06/26/2019    8:38 AM 01/24/2018   10:44 AM  Depression screen PHQ 2/9  Decreased Interest 0 0 0 0  Down, Depressed, Hopeless 0 0 0 0  PHQ - 2 Score 0 0 0 0  Altered sleeping 0 0 0   Tired, decreased energy 0 0 0   Change in appetite 0 0 0   Feeling  bad or failure about yourself  0 0 0   Trouble concentrating 0 0 0   Moving slowly or fidgety/restless 0 0 0   Suicidal thoughts 0 0 0   PHQ-9 Score 0 0 0   Difficult doing work/chores  Not difficult at all      Alcohol screening: [place] Office Visit from 03/15/2022 in [facility]  AUDIT-C Score 1       Immunizations and Health Maintenance: Health Maintenance  Topic Date Due   COVID-19 Vaccine (3 - Booster for [place] series) 03/31/2022 (Originally 08/21/2020)   INFLUENZA VACCINE  05/24/2022   PAP SMEAR-Modifier  01/13/2023   TETANUS/TDAP  05/16/2028   Hepatitis C Screening  Completed   HIV Screening  Completed   HPV VACCINES  Aged Out     Hep C Screening: done   STD testing and prevention (HIV/chl/gon/syphilis):  see above,  no additional testing desired by pt today - done previously, low risk  Intimate partner violence:  safe   Sexual History/Pain during Intercourse: Significant Other, denies   Menstrual History/LMP/Abnormal Bleeding:  Patient's last menstrual period was 03/02/2022 (approximate). Irregular since IUD - 2021   Incontinence Symptoms: none  Breast cancer:  Last Mammogram: *see HM list above Family hx - neg   Cervical cancer screening: UTD with GYN - [place]  Pt denies family hx of cancers - breast, ovarian, uterine, colon:     Osteoporosis:   Discussion on osteoporosis per age, including high calcium and vitamin D supplementation, weight bearing exercises   Skin cancer:  Hx of skin CA -  NO Discussed atypical lesions   Colorectal cancer:   Colonoscopy is not due per age   Discussed concerning signs and sx of CRC, pt denies   Lung cancer:   Low Dose CT Chest recommended if Age 22-80 years, 20 pack-year currently smoking OR have quit w/in 15years. Patient does not qualify.    Social History   Tobacco Use   Smoking status: Never   Smokeless tobacco: Never  Vaping Use   Vaping Use: Never used  Substance Use Topics   Alcohol use: Yes    Alcohol/week: 1.0 standard drink    Types: 1 Shots of liquor per week    Comment: ocassionally (3-4 x a month)   Drug use: No     Flowsheet Row Office Visit from 03/15/2022 in [facility]  AUDIT-C Score 1       Family History  Problem Relation Age of Onset   Diabetes Mother    Hypertension Mother    Hypertension Maternal Grandmother    Hyperlipidemia Maternal Grandfather    Hypertension Maternal Grandfather    Heart disease Maternal Grandfather    Leukemia Maternal Uncle      Blood pressure/Hypertension: BP Readings from Last 3 Encounters:  03/15/22 128/84  05/14/21 134/84  03/09/21 (!) 142/86    Weight/Obesity: Wt Readings from Last 3 Encounters:  03/15/22 173 lb 3.2 oz (78.6 kg)  03/09/21 171  lb 4.8 oz (77.7 kg)  01/13/20 169 lb 0.6 oz (76.7 kg)   BMI Readings from Last 3 Encounters:  03/15/22 27.13 kg/m  03/09/21 26.83 kg/m  01/13/20 26.48 kg/m     Lipids:  Lab Results  Component Value Date   CHOL 161 03/09/2021   CHOL 156 06/26/2019   CHOL 160 01/24/2018   Lab Results  Component Value Date   HDL 59 03/09/2021   HDL 58 06/26/2019   HDL 60 01/24/2018  Lab Results  Component Value Date   LDLCALC 92 03/09/2021   LDLCALC 85 06/26/2019   LDLCALC 89 01/24/2018   Lab Results  Component Value Date   TRIG 35 03/09/2021   TRIG 58 06/26/2019   TRIG 36 01/24/2018   Lab Results  Component Value Date   CHOLHDL 2.7 03/09/2021   CHOLHDL 2.7 06/26/2019   CHOLHDL 2.7 01/24/2018   No results found for: LDLDIRECT Based on the results of lipid panel his/her cardiovascular risk factor ( using [facility] )  in the next 10 years is: The ASCVD Risk score (Arnett DK, et al., 2019) failed to calculate for the following reasons:   The 2019 ASCVD risk score is only valid for ages 64 to 67  Glucose:  Glucose, Bld  Date Value Ref Range Status  03/09/2021 76 65 - 99 mg/dL Final    Comment:    .            Fasting reference interval .   06/26/2019 83 65 - 99 mg/dL Final    Comment:    .            Fasting reference interval .   01/24/2018 78 65 - 99 mg/dL Final    Comment:    .            Fasting reference interval .       Social History       Social History   Socioeconomic History   Marital status: Significant Other    Spouse name: Not on file   Number of children: Not on file   Years of education: 16   Highest education level: Bachelor's degree (e.g., BA, AB, BS)  Occupational History    Comment: Nurse  Tobacco Use   Smoking status: Never   Smokeless tobacco: Never  Vaping Use   Vaping Use: Never used  Substance and Sexual Activity   Alcohol use: Yes    Alcohol/week: 1.0 standard drink    Types: 1 Shots of liquor per week    Comment:  ocassionally (3-4 x a month)   Drug use: No   Sexual activity: Yes    Partners: Male    Birth control/protection: I.U.D.  Other Topics Concern   Not on file  Social History Narrative   Not on file   Social Determinants of Health   Financial Resource Strain: Low Risk    Difficulty of Paying Living Expenses: Not hard at all  Food Insecurity: No Food Insecurity   Worried About Charity fundraiser in the Last Year: Never true   [place] in the Last Year: Never true  Transportation Needs: No Transportation Needs   Lack of Transportation (Medical): No   Lack of Transportation (Non-Medical): No  Physical Activity: Insufficiently Active   Days of Exercise per Week: 1 day   Minutes of Exercise per Session: 10 min  Stress: No Stress Concern Present   Feeling of Stress : Only a little  Social Connections: Moderately Integrated   Frequency of Communication with Friends and Family: More than three times a week   Frequency of Social Gatherings with Friends and Family: Once a week   Attends Religious Services: More than 4 times per year   Active Member of [employer] or Organizations: No   Attends Archivist Meetings: Never   Marital Status: Living with partner    Family History        Family History  Problem Relation Age  of Onset   Diabetes Mother    Hypertension Mother    Hypertension Maternal Grandmother    Hyperlipidemia Maternal Grandfather    Hypertension Maternal Grandfather    Heart disease Maternal Grandfather    Leukemia Maternal Uncle     Patient Active Problem List   Diagnosis Date Noted   Iron deficiency anemia 01/25/2018   Fibroids 09/16/2015   Contraception, device intrauterine 03/29/2012    Past Surgical History:  Procedure Laterality Date   WISDOM TOOTH EXTRACTION  2008     Current Outpatient Medications:    losartan (COZAAR) 25 MG tablet, Take 1 tablet (25 mg total) by mouth daily., Disp: 30 tablet, Rfl: 0   losartan (COZAAR) 25 MG tablet,  Take 1 tablet (25 mg total) by mouth daily. (Patient not taking: Reported on 03/15/2022), Disp: 90 tablet, Rfl: 1  No Known Allergies  Patient Care Team: Delsa Grana, PA-C as PCP - General (Family Medicine)   Chart Review: I personally reviewed active problem list, medication list, allergies, family history, social history, health maintenance, notes from last encounter, lab results, imaging with the patient/caregiver today.   Review of Systems  Constitutional: Negative.   HENT: Negative.    Eyes: Negative.   Respiratory: Negative.    Cardiovascular: Negative.   Gastrointestinal: Negative.  Negative for abdominal pain, anal bleeding, diarrhea, nausea and vomiting.  Endocrine: Negative.   Genitourinary: Negative.   Musculoskeletal:  Positive for back pain (right mid back area/right flank).  Skin: Negative.   Allergic/Immunologic: Negative.   Neurological: Negative.   Hematological: Negative.   Psychiatric/Behavioral: Negative.    All other systems reviewed and are negative.        Objective:   Vitals:  Vitals:   03/15/22 1018  BP: 128/84  Pulse: 80  Resp: 16  Temp: 97.9 F (36.6 C)  TempSrc: Oral  SpO2: 100%  Weight: 173 lb 3.2 oz (78.6 kg)  Height: '5\' 7"'$  (1.702 m)    Body mass index is 27.13 kg/m.  Physical Exam Vitals reviewed.  Constitutional:      General: She is not in acute distress.    Appearance: Normal appearance. She is normal weight. She is not ill-appearing, toxic-appearing or diaphoretic.  HENT:     Head: Normocephalic and atraumatic.     Right Ear: External ear normal.     Left Ear: External ear normal.     Nose: Congestion present.     Mouth/Throat:     Mouth: Mucous membranes are moist.     Pharynx: Oropharynx is clear. Posterior oropharyngeal erythema present. No oropharyngeal exudate.  Eyes:     General: No scleral icterus.       Right eye: No discharge.        Left eye: No discharge.     Conjunctiva/sclera: Conjunctivae normal.   Cardiovascular:     Rate and Rhythm: Normal rate and regular rhythm.     Pulses: Normal pulses.     Heart sounds: Normal heart sounds. No murmur heard.   No friction rub. No gallop.  Pulmonary:     Effort: Pulmonary effort is normal. No respiratory distress.     Breath sounds: Normal breath sounds. No stridor. No wheezing, rhonchi or rales.  Chest:  Breasts:    Right: Normal.     Left: Normal.  Abdominal:     General: Bowel sounds are normal.     Palpations: There is mass (firm, non-mobile pelvic/suprapubic mass that goes about 5-6 cm superior to umbilicus, larger/higher on  right side than left).     Tenderness: There is no abdominal tenderness. There is no right CVA tenderness, left CVA tenderness, guarding or rebound.  Musculoskeletal:     Right lower leg: No edema.     Left lower leg: No edema.  Skin:    General: Skin is warm and dry.     Capillary Refill: Capillary refill takes less than 2 seconds.     Coloration: Skin is not jaundiced or pale.  Neurological:     Mental Status: She is alert. Mental status is at baseline.  Psychiatric:        Mood and Affect: Mood normal.        Behavior: Behavior normal.      Fall Risk:    03/15/2022   10:20 AM 03/09/2021   10:26 AM 06/26/2019    8:38 AM 01/24/2018   10:44 AM  Fall Risk   Falls in the past year? 0 0 0 No  Number falls in past yr:  0 0   Injury with Fall?  0 0   Risk for fall due to : No Fall Risks     Follow up Falls prevention discussed;Falls evaluation completed       Functional Status Survey: Is the patient deaf or have difficulty hearing?: No Does the patient have difficulty seeing, even when wearing glasses/contacts?: No Does the patient have difficulty concentrating, remembering, or making decisions?: No Does the patient have difficulty walking or climbing stairs?: No Does the patient have difficulty dressing or bathing?: No Does the patient have difficulty doing errands alone such as visiting a doctor's  office or shopping?: No   Assessment & Plan:    CPE completed today  USPSTF grade A and B recommendations reviewed with patient; age-appropriate recommendations, preventive care, screening tests, etc discussed and encouraged; healthy living encouraged; see AVS for patient education given to patient  Discussed importance of 150 minutes of physical activity weekly, AHA exercise recommendations given to pt in AVS/handout  Discussed importance of healthy diet:  eating lean meats and proteins, avoiding trans fats and saturated fats, avoid simple sugars and excessive carbs in diet, eat 6 servings of fruit/vegetables daily and drink plenty of water and avoid sweet beverages.    Recommended pt to do annual eye exam and routine dental exams/cleanings  Depression, alcohol, fall screening completed as documented above and per flowsheets  Advance Care planning information and packet discussed and offered today, encouraged pt to discuss with family members/spouse/partner/friends and complete Advanced directive packet and bring copy to office   Reviewed Health Maintenance: Health Maintenance  Topic Date Due   COVID-19 Vaccine (3 - Booster for Pfizer series) 03/31/2022 (Originally 08/21/2020)   INFLUENZA VACCINE  05/24/2022   PAP SMEAR-Modifier  01/13/2023   TETANUS/TDAP  05/16/2028   Hepatitis C Screening  Completed   HIV Screening  Completed   HPV VACCINES  Aged Out    Immunizations: Immunization History  Administered Date(s) Administered   Influenza-Unspecified 07/25/2015, 07/18/2019, 07/22/2021   PFIZER(Purple Top)SARS-COV-2 Vaccination 06/05/2020, 06/26/2020   PPD Test 09/28/2021   Tdap 10/24/2005, 05/16/2018   Vaccines:  HPV: up to at age 54 , ask insurance if age between 73-45  Shingrix: 51-64 yo and ask insurance if covered when patient above 22 yo Pneumonia:  educated and discussed with patient. Flu:  educated and discussed with patient. COVID:      ICD-10-CM   1. Annual  physical exam  P38.25 COMPLETE METABOLIC PANEL WITH GFR  CBC w/Diff/Platelet    Lipid panel    Vitamin D (25 hydroxy)    2. Hypertension, unspecified type  W54 COMPLETE METABOLIC PANEL WITH GFR    losartan (COZAAR) 25 MG tablet   blood pressure well controlled and at goal today - continue losartan 25    3. Vitamin D deficiency  O27.0 COMPLETE METABOLIC PANEL WITH GFR    Vitamin D (25 hydroxy)   off supplement, very low in the past    4. Iron deficiency  E61.1 CBC w/Diff/Platelet    Iron, TIBC and Ferritin Panel   anemia improved with IUD, iron deficiency was improving    5. Right flank pain  R10.9 US Renal   no CVA tenderness, positional, also reproducible with palpation, may be MSK, but renal US to r/o hydronephrosis with large mass/fibroids    6. Fibroids  D21.9 US Renal   larger, may be causing back pain, she is going to f/up with GYN    7. Abdominal mass, unspecified abdominal location  R19.00    very large, right flank pain, concerned that uterine fibroids have continued to enlarge and may be causing some hydronephrosis on right side? imaging discussed          Delsa Grana, PA-C 03/15/22 10:32 AM  [facility]

## 2022-03-16 LAB — LIPID PANEL
Cholesterol: 149 mg/dL (ref <200)
HDL: 50 mg/dL (ref >=50)
LDL Cholesterol (Calc): 86 mg/dL (calc)
Non-HDL Cholesterol (Calc): 99 mg/dL (calc) (ref <130)
Total CHOL/HDL Ratio: 3 (calc) (ref <5.0)
Triglycerides: 46 mg/dL (ref <150)

## 2022-03-16 LAB — CBC WITH DIFFERENTIAL/PLATELET
Absolute Monocytes: 340 cells/uL (ref 200–950)
Basophils Absolute: 11 cells/uL (ref 0–200)
Basophils Relative: 0.3 %
Eosinophils Absolute: 49 cells/uL (ref 15–500)
Eosinophils Relative: 1.4 %
HCT: 44.3 % (ref 35.0–45.0)
Hemoglobin: 13.6 g/dL (ref 11.7–15.5)
Lymphs Abs: 1271 cells/uL (ref 850–3900)
MCH: 23.2 pg — ABNORMAL LOW (ref 27.0–33.0)
MCHC: 30.7 g/dL — ABNORMAL LOW (ref 32.0–36.0)
MCV: 75.6 fL — ABNORMAL LOW (ref 80.0–100.0)
MPV: 11.3 fL (ref 7.5–12.5)
Monocytes Relative: 9.7 %
Neutro Abs: 1831 cells/uL (ref 1500–7800)
Neutrophils Relative %: 52.3 %
Platelets: 294 10*3/uL (ref 140–400)
RBC: 5.86 10*6/uL — ABNORMAL HIGH (ref 3.80–5.10)
RDW: 13.2 % (ref 11.0–15.0)
Total Lymphocyte: 36.3 %
WBC: 3.5 10*3/uL — ABNORMAL LOW (ref 3.8–10.8)

## 2022-03-16 LAB — COMPLETE METABOLIC PANEL WITH GFR
AG Ratio: 1.2 (calc) (ref 1.0–2.5)
ALT: 16 U/L (ref 6–29)
AST: 22 U/L (ref 10–30)
Albumin: 4.5 g/dL (ref 3.6–5.1)
Alkaline phosphatase (APISO): 80 U/L (ref 31–125)
BUN: 11 mg/dL (ref 7–25)
CO2: 29 mmol/L (ref 20–32)
Calcium: 9.9 mg/dL (ref 8.6–10.2)
Chloride: 105 mmol/L (ref 98–110)
Creat: 0.81 mg/dL (ref 0.50–0.97)
Globulin: 3.9 g/dL (calc) — ABNORMAL HIGH (ref 1.9–3.7)
Glucose, Bld: 80 mg/dL (ref 65–99)
Potassium: 4.4 mmol/L (ref 3.5–5.3)
Sodium: 140 mmol/L (ref 135–146)
Total Bilirubin: 0.3 mg/dL (ref 0.2–1.2)
Total Protein: 8.4 g/dL — ABNORMAL HIGH (ref 6.1–8.1)
eGFR: 96 mL/min/{1.73_m2} (ref >=60)

## 2022-03-16 LAB — IRON,TIBC AND FERRITIN PANEL
%SAT: 11 % (calc) — ABNORMAL LOW (ref 16–45)
Ferritin: 21 ng/mL (ref 16–154)
Iron: 41 ug/dL (ref 40–190)
TIBC: 377 mcg/dL (calc) (ref 250–450)

## 2022-03-16 LAB — VITAMIN D 25 HYDROXY (VIT D DEFICIENCY, FRACTURES): Vit D, 25-Hydroxy: 32 ng/mL (ref 30–100)

## 2022-03-24 ENCOUNTER — Ambulatory Visit
Admission: RE | Admit: 2022-03-24 | Discharge: 2022-03-24 | Disposition: A | Discharge status: Home / Self Care | Payer: 59 | Source: Ambulatory Visit | Attending: Family Medicine | Admitting: Family Medicine

## 2022-03-24 DIAGNOSIS — D219 Benign neoplasm of connective and other soft tissue, unspecified: Secondary | ICD-10-CM

## 2022-03-24 DIAGNOSIS — R109 Unspecified abdominal pain: Secondary | ICD-10-CM | POA: Diagnosis not present

## 2022-03-24 DIAGNOSIS — N1330 Unspecified hydronephrosis: Secondary | ICD-10-CM | POA: Diagnosis not present

## 2022-03-28 ENCOUNTER — Other Ambulatory Visit: Payer: Self-pay | Admitting: Family Medicine

## 2022-03-28 DIAGNOSIS — N1330 Unspecified hydronephrosis: Secondary | ICD-10-CM

## 2022-03-28 DIAGNOSIS — R1900 Intra-abdominal and pelvic swelling, mass and lump, unspecified site: Secondary | ICD-10-CM

## 2022-03-28 DIAGNOSIS — R109 Unspecified abdominal pain: Secondary | ICD-10-CM

## 2022-03-28 DIAGNOSIS — D259 Leiomyoma of uterus, unspecified: Secondary | ICD-10-CM

## 2022-03-28 NOTE — Progress Notes (Signed)
Pt with flank pain, pelvic/uterine mass/fibroids - getting much larger We did renal US and it did show some mild hydronephrosis F/up CT abd/pelvis GYN did recommend surgery previously for hysterectomy, but she did not want to do it yet.  Over the past couple years pelvic mass has gotten bigger, I am concerned it will cause urinary outflow obstruction if not addressed.  Pt otherwise asx. Will get f/up CT to assess size Pt still encouraged to f/up with GYN  Renal US: Study Result  Narrative & Impression  CLINICAL DATA:  Right flank pain   History of large fibroid uterus   Evaluate for hydronephrosis   EXAM: RENAL / URINARY TRACT ULTRASOUND COMPLETE   COMPARISON:  None available   FINDINGS: Right Kidney:   Renal measurements: 10.8 x 5.6 x 5.3 cm = volume: 168 mL. Mild right hydronephrosis. No mass.   Left Kidney:   Renal measurements: 10.1 x 5.5 x 5.1 cm = volume: 147 mL. Echogenicity within normal limits. No mass or hydronephrosis visualized.   Bladder:   Appears normal for degree of bladder distention.   Other:   Enlarged fibroid uterus partially visualized.   IMPRESSION: 1. Mild right hydronephrosis. 2. No significant abnormality of the left kidney.     Electronically Signed   By: Miachel Roux M.D.   On: 03/24/2022 16:31    Most recent pelvic US 05/22/2018: Study Result  Narrative & Impression  CLINICAL DATA:  Fibroids.  IUD string lost   EXAM: TRANSABDOMINAL AND TRANSVAGINAL ULTRASOUND OF PELVIS   TECHNIQUE: Both transabdominal and transvaginal ultrasound examinations of the pelvis were performed. Transabdominal technique was performed for global imaging of the pelvis including uterus, ovaries, adnexal regions, and pelvic cul-de-sac. It was necessary to proceed with endovaginal exam following the transabdominal exam to visualize the uterus, endometrium, ovaries and adnexa.   COMPARISON:  09/08/2015   FINDINGS: Uterus   Measurements: 15.7 x 8.1  x 10.7 cm. Multiple large solid masses throughout the uterus, the largest an anterior subserosal fibroid measuring up to 5.9 cm. Adjacent anterior left fundal fibroid measures up to 5.6 cm.   Endometrium   Thickness: 4 mm in thickness. Fluid noted within the endometrial canal. IUD is noted within the endometrial canal.   Right ovary   Measurements: 4.4 x 3.4 x 3.7 cm. Normal appearance/no adnexal mass.   Left ovary   Measurements: 3.1 x 2.3 x 1.9 cm. Normal appearance/no adnexal mass.   Other findings   No abnormal free fluid.   IMPRESSION: Enlarged fibroid uterus.   Fluid and IUD noted within the endometrial canal.     Electronically Signed   By: Rolm Baptise M.D.   On: 05/22/2018 10:29    CT abd/pelvis ordered - will discuss GYN f/up with patient    ICD-10-CM   1. Abdominal mass, unspecified abdominal location  R19.00 CT Abdomen Pelvis W Contrast    2. Hydronephrosis, right  N13.30 CT Abdomen Pelvis W Contrast    3. Uterine leiomyoma, unspecified location  D25.9 CT Abdomen Pelvis W Contrast   multiple, last [place] 2019, much larger - palpable uterus superior to umbilicus, new right flank pain and likely right hydronephrosis due to enlarged uterus    4. Right flank pain  R10.9 CT Abdomen Pelvis W Contrast     Delsa Grana, PA-C

## 2022-03-28 NOTE — Addendum Note (Signed)
Addended by: Delsa Grana on: 03/28/2022 12:28 PM   Modules accepted: Orders

## 2022-03-29 ENCOUNTER — Telehealth: Payer: Self-pay | Admitting: General Practice

## 2022-03-29 NOTE — Telephone Encounter (Signed)
[facility] and spoke with receptionist about patient needing CT scheduled that her PCP ordered prior to her visit here with Dr. Ihor Dow on 04/06/2022.

## 2022-04-05 ENCOUNTER — Ambulatory Visit
Admission: RE | Admit: 2022-04-05 | Discharge: 2022-04-05 | Disposition: A | Discharge status: Home / Self Care | Payer: 59 | Source: Ambulatory Visit | Attending: Family Medicine | Admitting: Family Medicine

## 2022-04-05 DIAGNOSIS — D259 Leiomyoma of uterus, unspecified: Secondary | ICD-10-CM | POA: Insufficient documentation

## 2022-04-05 DIAGNOSIS — R1900 Intra-abdominal and pelvic swelling, mass and lump, unspecified site: Secondary | ICD-10-CM | POA: Diagnosis not present

## 2022-04-05 DIAGNOSIS — N1330 Unspecified hydronephrosis: Secondary | ICD-10-CM | POA: Diagnosis not present

## 2022-04-05 DIAGNOSIS — R109 Unspecified abdominal pain: Secondary | ICD-10-CM | POA: Diagnosis not present

## 2022-04-05 MED ORDER — IOHEXOL 300 MG/ML  SOLN
100.0000 mL | Freq: Once | INTRAMUSCULAR | Status: AC | PRN
Start: 2022-04-05 — End: 2022-04-05
  Administered 2022-04-05: 100 mL via INTRAVENOUS

## 2022-04-06 ENCOUNTER — Other Ambulatory Visit (HOSPITAL_COMMUNITY)
Admission: RE | Admit: 2022-04-06 | Discharge: 2022-04-06 | Disposition: A | Discharge status: Home / Self Care | Payer: 59 | Source: Ambulatory Visit | Attending: Obstetrics & Gynecology | Admitting: Obstetrics & Gynecology

## 2022-04-06 ENCOUNTER — Encounter: Payer: Self-pay | Admitting: Obstetrics & Gynecology

## 2022-04-06 ENCOUNTER — Other Ambulatory Visit (HOSPITAL_COMMUNITY): Payer: Self-pay

## 2022-04-06 ENCOUNTER — Ambulatory Visit: Payer: 59 | Admitting: Obstetrics & Gynecology

## 2022-04-06 VITALS — BP 141/87 | HR 81 | Wt 178.0 lb

## 2022-04-06 DIAGNOSIS — D251 Intramural leiomyoma of uterus: Secondary | ICD-10-CM | POA: Diagnosis not present

## 2022-04-06 DIAGNOSIS — Z01419 Encounter for gynecological examination (general) (routine) without abnormal findings: Secondary | ICD-10-CM

## 2022-04-06 MED ORDER — MYFEMBREE 40-1-0.5 MG PO TABS
1.0000 | ORAL_TABLET | Freq: Every day | ORAL | 3 refills | Status: DC
  Filled 2022-04-06: qty 28, 28d supply, fill #0
  Filled 2022-06-20: qty 28, 28d supply, fill #1

## 2022-04-06 NOTE — Progress Notes (Signed)
Medication Samples have been provided to the patient.  Drug name: Myfembree       Strength: '40mg'$         Qty: 28 tab  LOT: [id number]  Exp.Date: 07-23-22  Dosing instructions: 1 tablet daily  The patient has been instructed regarding the correct time, dose, and frequency of taking this medication, including desired effects and most common side effects.   Kathrene Alu 3:16 PM 04/06/2022

## 2022-04-06 NOTE — Progress Notes (Signed)
Subjective:     Alicia Larson is a 36 y.o. female here for a routine exam.  Current complaints: enlarging fibroids. Bleeding well since LnIUD placed. She reports new onset back pain and CT revealed hydronephrosis. She does endorse freq urination.       Gynecologic History Patient's last menstrual period was 03/07/2022 (exact date). Contraception: IUD Last Pap: 01/13/2020. Results were: normal Last mammogram: n/a.   Obstetric History OB History  Gravida Para Term Preterm AB Living  0 0 0 0 0    SAB IAB Ectopic Multiple Live Births  0 0 0        The following portions of the patient's history were reviewed and updated as appropriate: allergies, current medications, past family history, past medical history, past social history, past surgical history, and problem list.  Review of Systems Pertinent items are noted in HPI.    Objective:  BP (!) 141/87   Pulse 81   Wt 178 lb (80.7 kg)   LMP 03/07/2022 (Exact Date)   BMI 27.88 kg/m   General Appearance:    Alert, cooperative, no distress, appears stated age  Head:    Normocephalic, without obvious abnormality, atraumatic  Eyes:    conjunctiva/corneas clear, EOM's intact, both eyes  Ears:    Normal external ear canals, both ears  Nose:   Nares normal, septum midline, mucosa normal, no drainage    or sinus tenderness  Throat:   Lips, mucosa, and tongue normal; teeth and gums normal  Neck:   Supple, symmetrical, trachea midline, no adenopathy;    thyroid:  no enlargement/tenderness/nodules  Back:     Symmetric, no curvature, ROM normal, no CVA tenderness  Lungs:     respirations unlabored  Chest Wall:    No tenderness or deformity   Heart:    Regular rate and rhythm  Breast Exam:    No tenderness, masses, or nipple abnormality  Abdomen:     Soft, non-tender, bowel sounds active all four quadrants,    no masses, no organomegaly Fibroids palpable to 3 cm below xyphoid; mobile. Extends to sidewalls.   Genitalia:    Normal female  without lesion, discharge or tenderness     Extremities:   Extremities normal, atraumatic, no cyanosis or edema  Pulses:   2+ and symmetric all extremities  Skin:   Skin color, texture, turgor normal, no rashes or lesions     04/05/2022 CLINICAL DATA:  Surgical planning, uterine fibroids, hydronephrosis.   EXAM: CT ABDOMEN AND PELVIS WITH CONTRAST   TECHNIQUE: Multidetector CT imaging of the abdomen and pelvis was performed using the standard protocol following bolus administration of intravenous contrast.   RADIATION DOSE REDUCTION: This exam was performed according to the departmental dose-optimization program which includes automated exposure control, adjustment of the mA and/or kV according to patient size and/or use of iterative reconstruction technique.   CONTRAST:  159m OMNIPAQUE IOHEXOL 300 MG/ML  SOLN   COMPARISON:  Renal ultrasound 03/24/2022   FINDINGS: Lower chest: No acute abnormality.   Hepatobiliary: No focal liver abnormality is seen. No gallstones, gallbladder wall thickening, or biliary dilatation.   Pancreas: Unremarkable. No pancreatic ductal dilatation or surrounding inflammatory changes.   Spleen: Normal in size without focal abnormality.   Adrenals/Urinary Tract: Adrenal glands appear normal. No suspicious renal mass identified bilaterally. Mild right and minimal left hydronephrosis, likely secondary to uterine compression on the ureters. Urinary bladder appears within normal limits.   Stomach/Bowel: No bowel obstruction, free air or pneumatosis. No bowel  wall edema identified. Appendix is normal.   Vascular/Lymphatic: No significant vascular findings are present. No enlarged abdominal or pelvic lymph nodes.   Reproductive: Uterus is lobulated, heterogeneous and markedly enlarged measuring up to approximately 11.5 x 22.5 x 24 cm in AP, transverse and fundal cervical dimensions. Largest appearing fibroid near the fundus on the left measures up  to 12.4 cm in size. Note is made of an IUD within the uterus. No suspicious adnexal mass visualized.   Other: No ascites.   Musculoskeletal: No acute or significant osseous findings.   IMPRESSION: 1. Markedly enlarged, lobulated fibroid uterus. 2. Mild right and minimal left hydronephrosis, likely secondary to uterus compressing the ureters.     Assessment:    Healthy female exam.   Large uterine fibroids- symptomatic and assoc with hydronephrosis.   Plan:   Diagnoses and all orders for this visit:  Fibroids, intramural -     Relugolix-Estradiol-Norethind (MYFEMBREE) 40-1-0.5 MG TABS; Take 1 tablet by mouth daily.  Well female exam with routine gynecological exam -     Cytology - PAP( [facility])   Patient desires surgical management with abd hysterectomy with placement of ureteral stents prior to case. Pt will take Myfembree for 3 months prior to the OR to attempt decrease the size of the fibroids. The risks of the meds were reviewed. No contraindications noted.     F/u in 3 months Pt wants case in Oct. Posted. She will f/u for official preop before the case.   Shayla Heming L. Harraway-Smith, M.D., Cherlynn June

## 2022-04-11 LAB — CYTOLOGY - PAP
Adequacy: ABSENT
Comment: NEGATIVE
Diagnosis: NEGATIVE
Diagnosis: REACTIVE
High risk HPV: NEGATIVE

## 2022-04-28 ENCOUNTER — Other Ambulatory Visit (HOSPITAL_COMMUNITY): Payer: Self-pay

## 2022-05-04 ENCOUNTER — Encounter: Payer: Self-pay | Admitting: Obstetrics & Gynecology

## 2022-05-06 ENCOUNTER — Other Ambulatory Visit (HOSPITAL_COMMUNITY): Payer: Self-pay

## 2022-05-09 ENCOUNTER — Telehealth: Payer: Self-pay

## 2022-05-09 NOTE — Telephone Encounter (Signed)
Called [phone] [facility] pharmacy help desk) for prior authorization completed for myfembree. Kathrene Alu  RN

## 2022-05-18 ENCOUNTER — Other Ambulatory Visit (HOSPITAL_COMMUNITY): Payer: Self-pay

## 2022-05-19 ENCOUNTER — Other Ambulatory Visit (HOSPITAL_COMMUNITY): Payer: Self-pay

## 2022-05-25 ENCOUNTER — Other Ambulatory Visit (HOSPITAL_COMMUNITY): Payer: Self-pay

## 2022-05-25 ENCOUNTER — Encounter: Payer: Self-pay | Admitting: Oncology

## 2022-06-02 ENCOUNTER — Encounter: Payer: Self-pay | Admitting: Obstetrics & Gynecology

## 2022-06-15 ENCOUNTER — Other Ambulatory Visit (HOSPITAL_COMMUNITY): Payer: Self-pay

## 2022-06-16 ENCOUNTER — Other Ambulatory Visit (HOSPITAL_COMMUNITY): Payer: Self-pay

## 2022-06-21 ENCOUNTER — Other Ambulatory Visit (HOSPITAL_COMMUNITY): Payer: Self-pay

## 2022-07-06 ENCOUNTER — Ambulatory Visit: Payer: 59 | Admitting: Obstetrics & Gynecology

## 2022-07-13 ENCOUNTER — Telehealth (INDEPENDENT_AMBULATORY_CARE_PROVIDER_SITE_OTHER): Payer: 59 | Admitting: Obstetrics & Gynecology

## 2022-07-13 ENCOUNTER — Telehealth: Payer: Self-pay | Admitting: Obstetrics & Gynecology

## 2022-07-13 ENCOUNTER — Encounter: Payer: Self-pay | Admitting: Obstetrics & Gynecology

## 2022-07-13 DIAGNOSIS — D251 Intramural leiomyoma of uterus: Secondary | ICD-10-CM

## 2022-07-13 DIAGNOSIS — D259 Leiomyoma of uterus, unspecified: Secondary | ICD-10-CM

## 2022-07-13 NOTE — Progress Notes (Signed)
TELEHEALTH GYNECOLOGY VISIT ENCOUNTER NOTE  Provider location: [facility]   Patient location: Home  Pt tried to connect via video however we were unable to get the audio and video connected on her end therefore,  I connected with Cristal Generous on 07/13/22 at  2:50 PM EDT by telephone and verified that I am speaking with the correct person using two identifiers. Patient was unable to do MyChart audiovisual encounter due to technical difficulties, she tried several times.    I discussed the limitations, risks, security and privacy concerns of performing an evaluation and management service by telephone and the availability of in person appointments. I also discussed with the patient that there may be a patient responsible charge related to this service. The patient expressed understanding and agreed to proceed.   History:  Alicia Larson is a 36 y.o. G0P0000 female being evaluated today for large uterine fibroids. She reports that she does not believe that the meds slowed down the growth at all. Her sx are the same. No new heavy bleeding or discharge, pelvic pain or other concerns.  She reports that she wishes to proceed with definitive surgical management.       Past Medical History:  Diagnosis Date   Anemia    Dermatitis 05/16/2013   02/28/2013. [place]. [place] PAC.  Dermatitis-Nummular (arms,legs,buttocks) Treatment Plan: Triamincinolone 0.1% Cream apply twice daily. Tinea versicolor (back x) Treatment Plan: Selenium Sulfide Wash twice weekly. Ketoconazole Cream aaa bid x 3 wks.    Elevated blood protein 01/25/2018   Fibroids 09/16/2015   Menorrhagia 09/03/2015   Past Surgical History:  Procedure Laterality Date   WISDOM TOOTH EXTRACTION  2008   The following portions of the patient's history were reviewed and updated as appropriate: allergies, current medications, past family history, past  medical history, past social history, past surgical history and problem list.   Health Maintenance:  Normal pap and negative HRHPV on 04/06/2022.    Review of Systems:  Pertinent items noted in HPI and remainder of comprehensive ROS otherwise negative.  Physical Exam:   General:  Alert, oriented and cooperative.   Mental Status: Normal mood and affect perceived. Normal judgment and thought content.  Physical exam deferred due to nature of the encounter  Labs and Imaging 04/05/2022 CLINICAL DATA:  Surgical planning, uterine fibroids, hydronephrosis.   EXAM: CT ABDOMEN AND PELVIS WITH CONTRAST   TECHNIQUE: Multidetector CT imaging of the abdomen and pelvis was performed using the standard protocol following bolus administration of intravenous contrast.   RADIATION DOSE REDUCTION: This exam was performed according to the departmental dose-optimization program which includes automated exposure control, adjustment of the mA and/or kV according to patient size and/or use of iterative reconstruction technique.   CONTRAST:  171m OMNIPAQUE IOHEXOL 300 MG/ML  SOLN   COMPARISON:  Renal ultrasound 03/24/2022   FINDINGS: Lower chest: No acute abnormality.   Hepatobiliary: No focal liver abnormality is seen. No gallstones, gallbladder wall thickening, or biliary dilatation.   Pancreas: Unremarkable. No pancreatic ductal dilatation or surrounding inflammatory changes.   Spleen: Normal in size without focal abnormality.   Adrenals/Urinary Tract: Adrenal glands appear normal. No suspicious renal mass identified bilaterally. Mild right and minimal left hydronephrosis, likely secondary to uterine compression on the ureters. Urinary bladder appears within normal limits.   Stomach/Bowel: No bowel obstruction, free air or pneumatosis. No bowel wall edema identified. Appendix is normal.   Vascular/Lymphatic: No significant vascular  findings are present. No enlarged abdominal or pelvic  lymph nodes.   Reproductive: Uterus is lobulated, heterogeneous and markedly enlarged measuring up to approximately 11.5 x 22.5 x 24 cm in AP, transverse and fundal cervical dimensions. Largest appearing fibroid near the fundus on the left measures up to 12.4 cm in size. Note is made of an IUD within the uterus. No suspicious adnexal mass visualized.   Other: No ascites.   Musculoskeletal: No acute or significant osseous findings.   IMPRESSION: 1. Markedly enlarged, lobulated fibroid uterus. 2. Mild right and minimal left hydronephrosis, likely secondary to uterus compressing the ureters.     Assessment and Plan:     Large uterine fibroids.      Patient desires surgical management with total abdominal hysterectomy with bilateral salpingectomy.  The risks of surgery were discussed in detail with the patient including but not limited to: bleeding which may require transfusion or reoperation; infection which may require prolonged hospitalization or re-hospitalization and antibiotic therapy; injury to bowel, bladder, ureters and major vessels or other surrounding organs; need for additional procedures including laparotomy; thromboembolic phenomenon, incisional problems and other postoperative or anesthesia complications.  Patient was told that the likelihood that her condition and symptoms will be treated effectively with this surgical management was very high; the postoperative expectations were also discussed in detail. The patient also understands the alternative treatment options which were discussed in full. All questions were answered.  She was told that she will be contacted by our surgical scheduler regarding the time and date of her surgery; routine preoperative instructions of having nothing to eat or drink after midnight on the day prior to surgery and also coming to the hospital 1 1/2 hours prior to her time of surgery were also emphasized.  She was told she may be called for a  preoperative appointment about a week prior to surgery and will be given further preoperative instructions at that visit. Printed patient education handouts about the procedure were given to the patient to review at home.    The patient was advised to call back or seek an in-person evaluation/go to the ED if the symptoms worsen or if the condition fails to improve as anticipated.  I provided 45 minutes of non-face-to-face time during this encounter.   Lavonia Drafts, MD [facility], [place]

## 2022-07-13 NOTE — Telephone Encounter (Signed)
See visit encounter for this call.   Anush Wiedeman L. Harraway-Smith, M.D., Cherlynn June

## 2022-07-15 NOTE — Pre-Procedure Instructions (Signed)
Surgical Instructions    Your procedure is scheduled on Tuesday, October 3rd.  Report to [facility] Main Entrance "A" at 05:30 A.M., then check in with the Admitting office.  Call this number if you have problems the morning of surgery:  [phone]   If you have any questions prior to your surgery date call [phone]: Open Monday-Friday 8am-4pm    Remember:  Do not eat after midnight the night before your surgery  You may drink clear liquids until 04:30 AM the morning of your surgery.   Clear liquids allowed are: Water, Non-Citrus Juices (without pulp), Carbonated Beverages, Clear Tea, Black Coffee Only (NO MILK, CREAM OR POWDERED CREAMER of any kind), and Gatorade.  Patient Instructions  The night before surgery:  No food after midnight. ONLY clear liquids after midnight  The day of surgery (if you do NOT have diabetes):  Drink ONE (1) Pre-Surgery Clear Ensure by 04:30 AM the morning of surgery. Drink in one sitting. Do not sip.  This drink was given to you during your hospital  pre-op appointment visit.  Nothing else to drink after completing the  Pre-Surgery Clear Ensure.          If you have questions, please contact your surgeon's office.     Take these medicines the morning of surgery with A SIP OF WATER  Relugolix-Estradiol-Norethind (MYFEMBREE)   As of today, STOP taking any Aspirin (unless otherwise instructed by your surgeon) Aleve, Naproxen, Ibuprofen, Motrin, Advil, Goody's, BC's, all herbal medications, fish oil, and all vitamins.                     Do NOT Smoke (Tobacco/Vaping) for 24 hours prior to your procedure.  If you use a CPAP at night, you may bring your mask/headgear for your overnight stay.   Contacts, glasses, piercing's, hearing aid's, dentures or partials may not be worn into surgery, please bring cases for these belongings.    For patients admitted to the hospital, discharge time will be determined by your treatment team.   Patients  discharged the day of surgery will not be allowed to drive home, and someone needs to stay with them for 24 hours.  SURGICAL WAITING ROOM VISITATION Patients having surgery or a procedure may have no more than 2 support people in the waiting area - these visitors may rotate.   Children under the age of 7 must have an adult with them who is not the patient. If the patient needs to stay at the hospital during part of their recovery, the visitor guidelines for inpatient rooms apply. Pre-op nurse will coordinate an appropriate time for 1 support person to accompany patient in pre-op.  This support person may not rotate.   Please refer to the [facility] website for the visitor guidelines for Inpatients (after your surgery is over and you are in a regular room).    Special instructions:   [place]- Preparing For Surgery  Before surgery, you can play an important role. Because skin is not sterile, your skin needs to be as free of germs as possible. You can reduce the number of germs on your skin by washing with CHG (chlorahexidine gluconate) Soap before surgery.  CHG is an antiseptic cleaner which kills germs and bonds with the skin to continue killing germs even after washing.    Oral Hygiene is also important to reduce your risk of infection.  Remember - BRUSH YOUR TEETH THE MORNING OF SURGERY WITH YOUR REGULAR TOOTHPASTE  Please do not use if you have an allergy to CHG or antibacterial soaps. If your skin becomes reddened/irritated stop using the CHG.  Do not shave (including legs and underarms) for at least 48 hours prior to first CHG shower. It is OK to shave your face.  Please follow these instructions carefully.   Shower the NIGHT BEFORE SURGERY and the MORNING OF SURGERY  If you chose to wash your hair, wash your hair first as usual with your normal shampoo.  After you shampoo, rinse your hair and body thoroughly to remove the shampoo.  Use CHG Soap as you would any other liquid  soap. You can apply CHG directly to the skin and wash gently with a scrungie or a clean washcloth.   Apply the CHG Soap to your body ONLY FROM THE NECK DOWN.  Do not use on open wounds or open sores. Avoid contact with your eyes, ears, mouth and genitals (private parts). Wash Face and genitals (private parts)  with your normal soap.   Wash thoroughly, paying special attention to the area where your surgery will be performed.  Thoroughly rinse your body with warm water from the neck down.  DO NOT shower/wash with your normal soap after using and rinsing off the CHG Soap.  Pat yourself dry with a CLEAN TOWEL.  Wear CLEAN PAJAMAS to bed the night before surgery  Place CLEAN SHEETS on your bed the night before your surgery  DO NOT SLEEP WITH PETS.   Day of Surgery: Take a shower with CHG soap. Do not wear jewelry or makeup Do not wear lotions, powders, perfumes, or deodorant. Do not shave 48 hours prior to surgery.   Do not bring valuables to the hospital. [facility] is not responsible for any belongings or valuables. Do not wear nail polish, gel polish, artificial nails, or any other type of covering on natural nails (fingers and toes) If you have artificial nails or gel coating that need to be removed by a nail salon, please have this removed prior to surgery. Artificial nails or gel coating may interfere with anesthesia's ability to adequately monitor your vital signs. Wear Clean/Comfortable clothing the morning of surgery Remember to brush your teeth WITH YOUR REGULAR TOOTHPASTE.   Please read over the following fact sheets that you were given.    If you received a COVID test during your pre-op visit  it is requested that you wear a mask when out in public, stay away from anyone that may not be feeling well and notify your surgeon if you develop symptoms. If you have been in contact with anyone that has tested positive in the last 10 days please notify you surgeon.

## 2022-07-18 ENCOUNTER — Encounter (HOSPITAL_COMMUNITY)
Admission: RE | Admit: 2022-07-18 | Discharge: 2022-07-18 | Disposition: A | Discharge status: Home / Self Care | Payer: 59 | Source: Ambulatory Visit | Attending: Obstetrics & Gynecology | Admitting: Obstetrics & Gynecology

## 2022-07-18 ENCOUNTER — Encounter (HOSPITAL_COMMUNITY): Payer: Self-pay

## 2022-07-18 ENCOUNTER — Other Ambulatory Visit: Payer: Self-pay

## 2022-07-18 VITALS — BP 153/92 | HR 85 | Temp 98.2°F | Resp 17 | Ht 67.0 in | Wt 176.9 lb

## 2022-07-18 DIAGNOSIS — I440 Atrioventricular block, first degree: Secondary | ICD-10-CM | POA: Insufficient documentation

## 2022-07-18 DIAGNOSIS — Z01818 Encounter for other preprocedural examination: Secondary | ICD-10-CM | POA: Insufficient documentation

## 2022-07-18 DIAGNOSIS — D219 Benign neoplasm of connective and other soft tissue, unspecified: Secondary | ICD-10-CM | POA: Diagnosis not present

## 2022-07-18 DIAGNOSIS — I10 Essential (primary) hypertension: Secondary | ICD-10-CM

## 2022-07-18 DIAGNOSIS — D500 Iron deficiency anemia secondary to blood loss (chronic): Secondary | ICD-10-CM | POA: Diagnosis not present

## 2022-07-18 DIAGNOSIS — I517 Cardiomegaly: Secondary | ICD-10-CM | POA: Insufficient documentation

## 2022-07-18 DIAGNOSIS — R9431 Abnormal electrocardiogram [ECG] [EKG]: Secondary | ICD-10-CM | POA: Diagnosis not present

## 2022-07-18 HISTORY — DX: Unspecified hydronephrosis: N13.30

## 2022-07-18 HISTORY — DX: Essential (primary) hypertension: I10

## 2022-07-18 LAB — CBC
HCT: 42.6 % (ref 36.0–46.0)
Hemoglobin: 13.3 g/dL (ref 12.0–15.0)
MCH: 24.1 pg — ABNORMAL LOW (ref 26.0–34.0)
MCHC: 31.2 g/dL (ref 30.0–36.0)
MCV: 77 fL — ABNORMAL LOW (ref 80.0–100.0)
Platelets: 275 10*3/uL (ref 150–400)
RBC: 5.53 MIL/uL — ABNORMAL HIGH (ref 3.87–5.11)
RDW: 15 % (ref 11.5–15.5)
WBC: 5.3 10*3/uL (ref 4.0–10.5)
nRBC: 0 % (ref 0.0–0.2)

## 2022-07-18 LAB — COMPREHENSIVE METABOLIC PANEL
ALT: 16 U/L (ref 0–44)
AST: 22 U/L (ref 15–41)
Albumin: 4 g/dL (ref 3.5–5.0)
Alkaline Phosphatase: 70 U/L (ref 38–126)
Anion gap: 5 (ref 5–15)
BUN: <5 mg/dL — ABNORMAL LOW (ref 6–20)
CO2: 26 mmol/L (ref 22–32)
Calcium: 9.3 mg/dL (ref 8.9–10.3)
Chloride: 107 mmol/L (ref 98–111)
Creatinine, Ser: 0.82 mg/dL (ref 0.44–1.00)
GFR, Estimated: >60 mL/min (ref >60)
Glucose, Bld: 98 mg/dL (ref 70–99)
Potassium: 3.6 mmol/L (ref 3.5–5.1)
Sodium: 138 mmol/L (ref 135–145)
Total Bilirubin: 0.5 mg/dL (ref 0.3–1.2)
Total Protein: 7.8 g/dL (ref 6.5–8.1)

## 2022-07-18 LAB — PREPARE RBC (CROSSMATCH)

## 2022-07-18 NOTE — Progress Notes (Signed)
PCP - Delsa Grana, PA-C Cardiologist - denies  PPM/ICD - denies   Chest x-ray - denies EKG - 07/18/22 Stress Test - denies ECHO - denies Cardiac Cath - denies  Sleep Study - denies  DM- denies  ASA/Blood Thinner Instructions: n/a   ERAS Protcol - yes PRE-SURGERY Ensure given at PAT  COVID TEST- n/a   Anesthesia review: no  Patient denies shortness of breath, fever, cough and chest pain at PAT appointment   All instructions explained to the patient, with a verbal understanding of the material. Patient agrees to go over the instructions while at home for a better understanding. The opportunity to ask questions was provided.

## 2022-07-22 ENCOUNTER — Encounter: Payer: Self-pay | Admitting: Obstetrics & Gynecology

## 2022-07-25 ENCOUNTER — Telehealth: Payer: Self-pay | Admitting: Obstetrics & Gynecology

## 2022-07-25 NOTE — Anesthesia Preprocedure Evaluation (Addendum)
Anesthesia Evaluation  Patient identified by MRN, date of birth, ID band Patient awake    Reviewed: Allergy & Precautions, NPO status , Patient's Chart, lab work & pertinent test results  History of Anesthesia Complications Negative for: history of anesthetic complications  Airway Mallampati: II  TM Distance: >3 FB Neck ROM: Full    Dental no notable dental hx.    Pulmonary neg pulmonary ROS   Pulmonary exam normal        Cardiovascular hypertension, Pt. on medications Normal cardiovascular exam     Neuro/Psych negative neurological ROS  negative psych ROS   GI/Hepatic negative GI ROS, Neg liver ROS,,,  Endo/Other  negative endocrine ROS    Renal/GU negative Renal ROS     Musculoskeletal negative musculoskeletal ROS (+)    Abdominal   Peds  Hematology negative hematology ROS (+)   Anesthesia Other Findings Fibroids  Hydronephrosis  Reproductive/Obstetrics Uterine fibroids                             Anesthesia Physical Anesthesia Plan  ASA: 2  Anesthesia Plan: General and Regional   Post-op Pain Management: Regional block*   Induction: Intravenous  PONV Risk Score and Plan: 4 or greater and Treatment may vary due to age or medical condition, Ondansetron, Dexamethasone, Midazolam, Scopolamine patch - Pre-op and Propofol infusion  Airway Management Planned: Oral ETT  Additional Equipment:   Intra-op Plan:   Post-operative Plan: Extubation in OR  Informed Consent: I have reviewed the patients History and Physical, chart, labs and discussed the procedure including the risks, benefits and alternatives for the proposed anesthesia with the patient or authorized representative who has indicated his/her understanding and acceptance.     Dental advisory given  Plan Discussed with: CRNA  Anesthesia Plan Comments:         Anesthesia Quick Evaluation

## 2022-07-26 ENCOUNTER — Telehealth: Payer: Self-pay | Admitting: Obstetrics & Gynecology

## 2022-07-26 ENCOUNTER — Telehealth: Payer: Self-pay

## 2022-07-26 DIAGNOSIS — D500 Iron deficiency anemia secondary to blood loss (chronic): Secondary | ICD-10-CM

## 2022-07-26 DIAGNOSIS — D219 Benign neoplasm of connective and other soft tissue, unspecified: Secondary | ICD-10-CM

## 2022-07-26 LAB — TYPE AND SCREEN
ABO/RH(D): O POS
Antibody Screen: NEGATIVE

## 2022-07-26 NOTE — Telephone Encounter (Signed)
TC to pt to see if she has further questions and confirm that I did follow up on her surgery cancellation.   She is requesting an earlier date if possible. I will check to see if her case can be done sooner.   Zebbie Ace L. Harraway-Smith, M.D., [place]lynn June

## 2022-07-26 NOTE — Telephone Encounter (Signed)
Called patient to discuss potential reschedule surgery dates, patient is agreeable to 11/7 date.

## 2022-07-26 NOTE — Telephone Encounter (Signed)
Pt stated that she spoke with [facility] and as of 1:51 pm yesterday 07/25/2022 that her surgery was approved fot the dates of 07/26/2022-07/27/2022 and if you need to call the case number is [id number] if you can call her with an update on the rescheduling of her surgery

## 2022-07-27 ENCOUNTER — Telehealth: Payer: Self-pay

## 2022-07-27 NOTE — Telephone Encounter (Signed)
Called and spoke with patient, about moving her surgery date up to 10/17. Dr. Ihor Dow agreed to do this on her admin time, potential surgery time and location given to patient, patient agreeable with this plan, surgery moved up to 10/17

## 2022-07-27 NOTE — Telephone Encounter (Signed)
TC to pt to try to find out info of ins to see why here case was not approved by [facility].   After multiple calls to [facility], I had to notify pt that her case needed to be rescheduled. I apologized because it appeared that no one in our ofc contacted her. Will attempt to get more details in the am.   I called [facility] main to notify the, of the cancellation.   Adedamola Seto L. Harraway-Smith, M.D., Cherlynn June

## 2022-07-27 NOTE — Telephone Encounter (Signed)
TC to pt to try to find out info of ins to see why here case was not approved by [facility]   After multiple calls to [facility], I had to notify pt that her case needed to be rescheduled. I apologized because it appeared that no one in our ofc contacted her. Will attempt to get more details in the am.   I called [facility] main to notify the, of the cancellation.   Kaytlin Burklow L. Harraway-Smith, M.D., Cherlynn June

## 2022-07-28 ENCOUNTER — Encounter: Payer: Self-pay | Admitting: General Practice

## 2022-08-03 ENCOUNTER — Encounter: Payer: Self-pay | Admitting: Obstetrics & Gynecology

## 2022-08-03 ENCOUNTER — Encounter: Payer: Self-pay | Admitting: General Practice

## 2022-08-08 NOTE — Progress Notes (Signed)
ERAS Protcol - Finish the Ensure by 1130  Patient verbally denies any shortness of breath, fever, cough and chest pain during phone call   -------------  SDW INSTRUCTIONS given:  Your procedure is scheduled on 08/09/22.  Report to [facility] Main Entrance "A" at [facility] and check in at the Admitting office.  Call this number if you have problems the morning of surgery:  [phone]   Remember:  Do not eat after midnight the night before your surgery  You may drink clear liquids until 1130 the morning of your surgery.   Clear liquids allowed are: Water, Non-Citrus Juices (without pulp), Carbonated Beverages, Clear Tea, Black Coffee Only, and Gatorade    Take these medicines the morning of surgery with A SIP OF WATER  N/A  As of today, STOP taking any Aspirin (unless otherwise instructed by your surgeon) Aleve, Naproxen, Ibuprofen, Motrin, Advil, Goody's, BC's, all herbal medications, fish oil, and all vitamins.                      Do not wear jewelry, make up, or nail polish            Do not wear lotions, powders, perfumes/colognes, or deodorant.            Do not shave 48 hours prior to surgery.  Men may shave face and neck.            Do not bring valuables to the hospital.            [facility] is not responsible for any belongings or valuables.  Do NOT Smoke (Tobacco/Vaping) 24 hours prior to your procedure If you use a CPAP at night, you may bring all equipment for your overnight stay.   Contacts, glasses, dentures or bridgework may not be worn into surgery.      For patients admitted to the hospital, discharge time will be determined by your treatment team.   Patients discharged the day of surgery will not be allowed to drive home, and someone needs to stay with them for 24 hours.    Special instructions:   [facility]- Preparing For Surgery  Before surgery, you can play an important role. Because skin is not sterile, your skin needs to be as free of germs as  possible. You can reduce the number of germs on your skin by washing with CHG (chlorahexidine gluconate) Soap before surgery.  CHG is an antiseptic cleaner which kills germs and bonds with the skin to continue killing germs even after washing.    Oral Hygiene is also important to reduce your risk of infection.  Remember - BRUSH YOUR TEETH THE MORNING OF SURGERY WITH YOUR REGULAR TOOTHPASTE  Please do not use if you have an allergy to CHG or antibacterial soaps. If your skin becomes reddened/irritated stop using the CHG.  Do not shave (including legs and underarms) for at least 48 hours prior to first CHG shower. It is OK to shave your face.  Please follow these instructions carefully.   Shower the NIGHT BEFORE SURGERY and the MORNING OF SURGERY with DIAL Soap.   Pat yourself dry with a CLEAN TOWEL.  Wear CLEAN PAJAMAS to bed the night before surgery  Place CLEAN SHEETS on your bed the night of your first shower and DO NOT SLEEP WITH PETS.   Day of Surgery: Please shower morning of surgery  Wear Clean/Comfortable clothing the morning of surgery Do not apply any deodorants/lotions.  Remember to brush your teeth WITH YOUR REGULAR TOOTHPASTE.   Questions were answered. Patient verbalized understanding of instructions.

## 2022-08-09 ENCOUNTER — Inpatient Hospital Stay (HOSPITAL_COMMUNITY): Payer: 59

## 2022-08-09 ENCOUNTER — Other Ambulatory Visit: Payer: Self-pay

## 2022-08-09 ENCOUNTER — Inpatient Hospital Stay (HOSPITAL_COMMUNITY)
Admission: RE | Admit: 2022-08-09 | Discharge: 2022-08-10 | DRG: 742 | Disposition: A | Discharge status: Home / Self Care | Payer: 59 | Attending: Obstetrics & Gynecology | Admitting: Obstetrics & Gynecology

## 2022-08-09 ENCOUNTER — Encounter (HOSPITAL_COMMUNITY): Payer: Self-pay | Admitting: Obstetrics & Gynecology

## 2022-08-09 ENCOUNTER — Encounter (HOSPITAL_COMMUNITY)
Admission: RE | Disposition: A | Discharge status: Home / Self Care | Payer: Self-pay | Source: Home / Self Care | Attending: Obstetrics & Gynecology

## 2022-08-09 DIAGNOSIS — I10 Essential (primary) hypertension: Secondary | ICD-10-CM | POA: Diagnosis present

## 2022-08-09 DIAGNOSIS — D252 Subserosal leiomyoma of uterus: Secondary | ICD-10-CM | POA: Diagnosis not present

## 2022-08-09 DIAGNOSIS — Z79890 Hormone replacement therapy (postmenopausal): Secondary | ICD-10-CM | POA: Diagnosis not present

## 2022-08-09 DIAGNOSIS — N1330 Unspecified hydronephrosis: Secondary | ICD-10-CM | POA: Diagnosis present

## 2022-08-09 DIAGNOSIS — Z83438 Family history of other disorder of lipoprotein metabolism and other lipidemia: Secondary | ICD-10-CM

## 2022-08-09 DIAGNOSIS — Z8249 Family history of ischemic heart disease and other diseases of the circulatory system: Secondary | ICD-10-CM | POA: Diagnosis not present

## 2022-08-09 DIAGNOSIS — Z9889 Other specified postprocedural states: Secondary | ICD-10-CM

## 2022-08-09 DIAGNOSIS — D259 Leiomyoma of uterus, unspecified: Secondary | ICD-10-CM | POA: Diagnosis not present

## 2022-08-09 DIAGNOSIS — D250 Submucous leiomyoma of uterus: Secondary | ICD-10-CM | POA: Diagnosis not present

## 2022-08-09 DIAGNOSIS — D219 Benign neoplasm of connective and other soft tissue, unspecified: Secondary | ICD-10-CM

## 2022-08-09 DIAGNOSIS — D500 Iron deficiency anemia secondary to blood loss (chronic): Principal | ICD-10-CM

## 2022-08-09 DIAGNOSIS — Z90710 Acquired absence of both cervix and uterus: Secondary | ICD-10-CM | POA: Insufficient documentation

## 2022-08-09 DIAGNOSIS — Z833 Family history of diabetes mellitus: Secondary | ICD-10-CM | POA: Diagnosis not present

## 2022-08-09 DIAGNOSIS — G8918 Other acute postprocedural pain: Secondary | ICD-10-CM | POA: Diagnosis not present

## 2022-08-09 DIAGNOSIS — D251 Intramural leiomyoma of uterus: Principal | ICD-10-CM | POA: Diagnosis present

## 2022-08-09 HISTORY — PX: BILATERAL SALPINGECTOMY: SHX5743

## 2022-08-09 HISTORY — DX: Intramural leiomyoma of uterus: D25.1

## 2022-08-09 HISTORY — PX: ABDOMINAL HYSTERECTOMY: SHX81

## 2022-08-09 LAB — TYPE AND SCREEN
ABO/RH(D): O POS
Antibody Screen: NEGATIVE

## 2022-08-09 LAB — POCT I-STAT, CHEM 8
BUN: 3 mg/dL — ABNORMAL LOW (ref 6–20)
Calcium, Ion: 1.2 mmol/L (ref 1.15–1.40)
Chloride: 105 mmol/L (ref 98–111)
Creatinine, Ser: 0.7 mg/dL (ref 0.44–1.00)
Glucose, Bld: 90 mg/dL (ref 70–99)
HCT: 35 % — ABNORMAL LOW (ref 36.0–46.0)
Hemoglobin: 11.9 g/dL — ABNORMAL LOW (ref 12.0–15.0)
Potassium: 3.3 mmol/L — ABNORMAL LOW (ref 3.5–5.1)
Sodium: 142 mmol/L (ref 135–145)
TCO2: 23 mmol/L (ref 22–32)

## 2022-08-09 LAB — POCT PREGNANCY, URINE: Preg Test, Ur: NEGATIVE

## 2022-08-09 LAB — SURGICAL PCR SCREEN
MRSA, PCR: NEGATIVE
Staphylococcus aureus: NEGATIVE

## 2022-08-09 SURGERY — HYSTERECTOMY, ABDOMINAL
Anesthesia: Regional

## 2022-08-09 MED ORDER — LIDOCAINE HCL (PF) 2 % IJ SOLN
INTRAMUSCULAR | Status: DC | PRN
  Administered 2022-08-09: 1.584 mg/kg/h

## 2022-08-09 MED ORDER — OXYCODONE HCL 5 MG/5ML PO SOLN: 5.0000 mg | Freq: Once | ORAL | Status: DC | PRN

## 2022-08-09 MED ORDER — KETOROLAC TROMETHAMINE 30 MG/ML IJ SOLN: 30.0000 mg | Freq: Once | INTRAMUSCULAR | Status: DC | PRN

## 2022-08-09 MED ORDER — NALOXONE HCL 0.4 MG/ML IJ SOLN
INTRAMUSCULAR | Status: AC
  Filled 2022-08-09: qty 1

## 2022-08-09 MED ORDER — DIPHENHYDRAMINE HCL 50 MG/ML IJ SOLN
INTRAMUSCULAR | Status: DC | PRN
  Administered 2022-08-09: 12.5 mg via INTRAVENOUS

## 2022-08-09 MED ORDER — SIMETHICONE 80 MG PO CHEW: 80.0000 mg | CHEWABLE_TABLET | Freq: Four times a day (QID) | ORAL | Status: DC | PRN

## 2022-08-09 MED ORDER — OXYCODONE-ACETAMINOPHEN 5-325 MG PO TABS
1.0000 | ORAL_TABLET | ORAL | Status: DC | PRN
  Administered 2022-08-09 – 2022-08-10 (×3): 2 via ORAL
  Filled 2022-08-09 (×3): qty 2

## 2022-08-09 MED ORDER — NALOXONE HCL 0.4 MG/ML IJ SOLN
INTRAMUSCULAR | Status: DC | PRN
  Administered 2022-08-09: 120 ug via INTRAVENOUS

## 2022-08-09 MED ORDER — KETOROLAC TROMETHAMINE 30 MG/ML IJ SOLN
INTRAMUSCULAR | Status: DC | PRN
  Administered 2022-08-09: 30 mg via INTRAVENOUS

## 2022-08-09 MED ORDER — BUPIVACAINE HCL (PF) 0.5 % IJ SOLN
INTRAMUSCULAR | Status: AC
  Filled 2022-08-09: qty 30

## 2022-08-09 MED ORDER — 0.9 % SODIUM CHLORIDE (POUR BTL) OPTIME
TOPICAL | Status: DC | PRN
  Administered 2022-08-09: 1000 mL

## 2022-08-09 MED ORDER — HYDROMORPHONE HCL 1 MG/ML IJ SOLN: 0.2000 mg | INTRAMUSCULAR | Status: DC | PRN

## 2022-08-09 MED ORDER — MIDAZOLAM HCL 5 MG/5ML IJ SOLN
INTRAMUSCULAR | Status: DC | PRN
  Administered 2022-08-09: 2 mg via INTRAVENOUS

## 2022-08-09 MED ORDER — BUPIVACAINE HCL (PF) 0.5 % IJ SOLN
INTRAMUSCULAR | Status: DC | PRN
  Administered 2022-08-09: 7 mL

## 2022-08-09 MED ORDER — PROMETHAZINE HCL 25 MG/ML IJ SOLN: 6.2500 mg | INTRAMUSCULAR | Status: DC | PRN

## 2022-08-09 MED ORDER — PANTOPRAZOLE SODIUM 40 MG PO TBEC
40.0000 mg | DELAYED_RELEASE_TABLET | Freq: Every day | ORAL | Status: DC
  Administered 2022-08-09 – 2022-08-10 (×2): 40 mg via ORAL
  Filled 2022-08-09 (×2): qty 1

## 2022-08-09 MED ORDER — KETAMINE HCL 10 MG/ML IJ SOLN
INTRAMUSCULAR | Status: DC | PRN
  Administered 2022-08-09: 20 mg via INTRAVENOUS
  Administered 2022-08-09: 10 mg via INTRAVENOUS

## 2022-08-09 MED ORDER — ALBUMIN HUMAN 5 % IV SOLN: INTRAVENOUS | Status: DC | PRN

## 2022-08-09 MED ORDER — SODIUM CHLORIDE (PF) 0.9 % IJ SOLN
INTRAMUSCULAR | Status: AC
  Filled 2022-08-09: qty 50

## 2022-08-09 MED ORDER — ONDANSETRON HCL 4 MG/2ML IJ SOLN
INTRAMUSCULAR | Status: DC | PRN
  Administered 2022-08-09: 4 mg via INTRAVENOUS

## 2022-08-09 MED ORDER — POVIDONE-IODINE 10 % EX SWAB
2.0000 | Freq: Once | CUTANEOUS | Status: AC
  Administered 2022-08-09: 2 via TOPICAL

## 2022-08-09 MED ORDER — MENTHOL 3 MG MT LOZG: 1.0000 | LOZENGE | OROMUCOSAL | Status: DC | PRN

## 2022-08-09 MED ORDER — MIDAZOLAM HCL 2 MG/2ML IJ SOLN
INTRAMUSCULAR | Status: AC
  Administered 2022-08-09: 2 mg via INTRAVENOUS
  Filled 2022-08-09: qty 2

## 2022-08-09 MED ORDER — ONDANSETRON HCL 4 MG/2ML IJ SOLN
4.0000 mg | Freq: Four times a day (QID) | INTRAMUSCULAR | Status: DC | PRN
  Administered 2022-08-09: 4 mg via INTRAVENOUS
  Filled 2022-08-09: qty 2

## 2022-08-09 MED ORDER — MIDAZOLAM HCL 2 MG/2ML IJ SOLN: 2.0000 mg | Freq: Once | INTRAMUSCULAR | Status: AC

## 2022-08-09 MED ORDER — KETOROLAC TROMETHAMINE 30 MG/ML IJ SOLN
INTRAMUSCULAR | Status: AC
  Filled 2022-08-09: qty 1

## 2022-08-09 MED ORDER — BUPIVACAINE HCL (PF) 0.25 % IJ SOLN
INTRAMUSCULAR | Status: DC | PRN
  Administered 2022-08-09 (×2): 30 mL via PERINEURAL

## 2022-08-09 MED ORDER — ACETAMINOPHEN 500 MG PO TABS: 1000.0000 mg | ORAL_TABLET | Freq: Once | ORAL | Status: AC

## 2022-08-09 MED ORDER — MIDAZOLAM HCL 2 MG/2ML IJ SOLN
INTRAMUSCULAR | Status: AC
  Filled 2022-08-09: qty 2

## 2022-08-09 MED ORDER — VASOPRESSIN 20 UNIT/ML IV SOLN
INTRAVENOUS | Status: AC
  Filled 2022-08-09: qty 1

## 2022-08-09 MED ORDER — DEXTROSE-NACL 5-0.45 % IV SOLN: INTRAVENOUS | Status: AC

## 2022-08-09 MED ORDER — CEFAZOLIN SODIUM-DEXTROSE 2-4 GM/100ML-% IV SOLN
2.0000 g | INTRAVENOUS | Status: AC
  Administered 2022-08-09: 2 g via INTRAVENOUS
  Filled 2022-08-09: qty 100

## 2022-08-09 MED ORDER — HYDROMORPHONE HCL 1 MG/ML IJ SOLN: 0.2500 mg | INTRAMUSCULAR | Status: DC | PRN

## 2022-08-09 MED ORDER — DIPHENHYDRAMINE HCL 50 MG/ML IJ SOLN
INTRAMUSCULAR | Status: AC
  Filled 2022-08-09: qty 1

## 2022-08-09 MED ORDER — ORAL CARE MOUTH RINSE: 15.0000 mL | Freq: Once | OROMUCOSAL | Status: AC

## 2022-08-09 MED ORDER — FENTANYL CITRATE (PF) 250 MCG/5ML IJ SOLN
INTRAMUSCULAR | Status: AC
  Filled 2022-08-09: qty 5

## 2022-08-09 MED ORDER — DEXAMETHASONE SODIUM PHOSPHATE 10 MG/ML IJ SOLN
INTRAMUSCULAR | Status: DC | PRN
  Administered 2022-08-09: 10 mg via INTRAVENOUS

## 2022-08-09 MED ORDER — ZOLPIDEM TARTRATE 5 MG PO TABS: 5.0000 mg | ORAL_TABLET | Freq: Every evening | ORAL | Status: DC | PRN

## 2022-08-09 MED ORDER — LIDOCAINE 2% (20 MG/ML) 5 ML SYRINGE
INTRAMUSCULAR | Status: DC | PRN
  Administered 2022-08-09: 40 mg via INTRAVENOUS

## 2022-08-09 MED ORDER — ROCURONIUM BROMIDE 50 MG/5ML IV SOSY
PREFILLED_SYRINGE | INTRAVENOUS | Status: DC | PRN
  Administered 2022-08-09: 30 mg via INTRAVENOUS
  Administered 2022-08-09: 50 mg via INTRAVENOUS

## 2022-08-09 MED ORDER — PROPOFOL 10 MG/ML IV BOLUS
INTRAVENOUS | Status: DC | PRN
  Administered 2022-08-09: 200 mg via INTRAVENOUS

## 2022-08-09 MED ORDER — ONDANSETRON HCL 4 MG PO TABS: 4.0000 mg | ORAL_TABLET | Freq: Four times a day (QID) | ORAL | Status: DC | PRN

## 2022-08-09 MED ORDER — OXYCODONE HCL 5 MG PO TABS: 5.0000 mg | ORAL_TABLET | Freq: Once | ORAL | Status: DC | PRN

## 2022-08-09 MED ORDER — SOD CITRATE-CITRIC ACID 500-334 MG/5ML PO SOLN
30.0000 mL | ORAL | Status: AC
  Administered 2022-08-09: 30 mL via ORAL
  Filled 2022-08-09: qty 30

## 2022-08-09 MED ORDER — DEXAMETHASONE SODIUM PHOSPHATE 10 MG/ML IJ SOLN
INTRAMUSCULAR | Status: AC
  Filled 2022-08-09: qty 1

## 2022-08-09 MED ORDER — LIDOCAINE 2% (20 MG/ML) 5 ML SYRINGE
INTRAMUSCULAR | Status: AC
  Filled 2022-08-09: qty 5

## 2022-08-09 MED ORDER — PHENYLEPHRINE 80 MCG/ML (10ML) SYRINGE FOR IV PUSH (FOR BLOOD PRESSURE SUPPORT)
PREFILLED_SYRINGE | INTRAVENOUS | Status: DC | PRN
  Administered 2022-08-09: 160 ug via INTRAVENOUS
  Administered 2022-08-09: 80 ug via INTRAVENOUS
  Administered 2022-08-09: 160 ug via INTRAVENOUS

## 2022-08-09 MED ORDER — AMISULPRIDE (ANTIEMETIC) 5 MG/2ML IV SOLN: 10.0000 mg | Freq: Once | INTRAVENOUS | Status: DC | PRN

## 2022-08-09 MED ORDER — IBUPROFEN 600 MG PO TABS: 600.0000 mg | ORAL_TABLET | Freq: Four times a day (QID) | ORAL | Status: DC

## 2022-08-09 MED ORDER — KETOROLAC TROMETHAMINE 30 MG/ML IJ SOLN
30.0000 mg | Freq: Four times a day (QID) | INTRAMUSCULAR | Status: DC
  Administered 2022-08-09 – 2022-08-10 (×3): 30 mg via INTRAVENOUS
  Filled 2022-08-09 (×3): qty 1

## 2022-08-09 MED ORDER — LACTATED RINGERS IV SOLN: INTRAVENOUS | Status: DC

## 2022-08-09 MED ORDER — SCOPOLAMINE 1 MG/3DAYS TD PT72
1.0000 | MEDICATED_PATCH | Freq: Once | TRANSDERMAL | Status: DC
  Administered 2022-08-09: 1.5 mg via TRANSDERMAL
  Filled 2022-08-09: qty 1

## 2022-08-09 MED ORDER — DOCUSATE SODIUM 100 MG PO CAPS
100.0000 mg | ORAL_CAPSULE | Freq: Two times a day (BID) | ORAL | Status: DC
  Administered 2022-08-09 – 2022-08-10 (×2): 100 mg via ORAL
  Filled 2022-08-09 (×2): qty 1

## 2022-08-09 MED ORDER — FENTANYL CITRATE (PF) 100 MCG/2ML IJ SOLN: 25.0000 ug | INTRAMUSCULAR | Status: DC | PRN

## 2022-08-09 MED ORDER — FENTANYL CITRATE (PF) 250 MCG/5ML IJ SOLN
INTRAMUSCULAR | Status: DC | PRN
  Administered 2022-08-09: 100 ug via INTRAVENOUS
  Administered 2022-08-09: 50 ug via INTRAVENOUS
  Administered 2022-08-09: 100 ug via INTRAVENOUS
  Administered 2022-08-09: 50 ug via INTRAVENOUS
  Administered 2022-08-09: 100 ug via INTRAVENOUS

## 2022-08-09 MED ORDER — SUGAMMADEX SODIUM 200 MG/2ML IV SOLN
INTRAVENOUS | Status: DC | PRN
  Administered 2022-08-09: 300 mg via INTRAVENOUS

## 2022-08-09 MED ORDER — ACETAMINOPHEN 500 MG PO TABS
1000.0000 mg | ORAL_TABLET | ORAL | Status: AC
  Administered 2022-08-09: 1000 mg via ORAL
  Filled 2022-08-09: qty 2

## 2022-08-09 MED ORDER — LABETALOL HCL 5 MG/ML IV SOLN
INTRAVENOUS | Status: AC
  Filled 2022-08-09: qty 4

## 2022-08-09 MED ORDER — PHENYLEPHRINE 80 MCG/ML (10ML) SYRINGE FOR IV PUSH (FOR BLOOD PRESSURE SUPPORT)
PREFILLED_SYRINGE | INTRAVENOUS | Status: AC
  Filled 2022-08-09: qty 10

## 2022-08-09 MED ORDER — BISACODYL 10 MG RE SUPP: 10.0000 mg | Freq: Every day | RECTAL | Status: DC | PRN

## 2022-08-09 MED ORDER — DROPERIDOL 2.5 MG/ML IJ SOLN: 0.6250 mg | Freq: Once | INTRAMUSCULAR | Status: DC | PRN

## 2022-08-09 MED ORDER — POLYETHYLENE GLYCOL 3350 17 G PO PACK: 17.0000 g | PACK | Freq: Every day | ORAL | Status: DC | PRN

## 2022-08-09 MED ORDER — LABETALOL HCL 5 MG/ML IV SOLN
5.0000 mg | INTRAVENOUS | Status: DC | PRN
  Administered 2022-08-09 (×3): 5 mg via INTRAVENOUS

## 2022-08-09 MED ORDER — CHLORHEXIDINE GLUCONATE 0.12 % MT SOLN
15.0000 mL | Freq: Once | OROMUCOSAL | Status: AC
  Administered 2022-08-09: 15 mL via OROMUCOSAL
  Filled 2022-08-09: qty 15

## 2022-08-09 MED ORDER — KETAMINE HCL 50 MG/5ML IJ SOSY
PREFILLED_SYRINGE | INTRAMUSCULAR | Status: AC
  Filled 2022-08-09: qty 5

## 2022-08-09 MED ORDER — FENTANYL CITRATE (PF) 100 MCG/2ML IJ SOLN
INTRAMUSCULAR | Status: AC
  Filled 2022-08-09: qty 2

## 2022-08-09 MED ORDER — HEMOSTATIC AGENTS (NO CHARGE) OPTIME
TOPICAL | Status: DC | PRN
  Administered 2022-08-09: 1 via TOPICAL

## 2022-08-09 SURGICAL SUPPLY — 47 items
BAG COUNTER SPONGE SURGICOUNT (BAG) ×2 IMPLANT
BARRIER ADHS 3X4 INTERCEED (GAUZE/BANDAGES/DRESSINGS) IMPLANT
BENZOIN TINCTURE PRP APPL 2/3 (GAUZE/BANDAGES/DRESSINGS) IMPLANT
CANISTER SUCT 3000ML PPV (MISCELLANEOUS) ×2 IMPLANT
CLIP VESOCCLUDE SM NARROW 6/CT (CLIP) ×2 IMPLANT
DRAPE WARM FLUID 44X44 (DRAPES) IMPLANT
DRSG OPSITE POSTOP 4X10 (GAUZE/BANDAGES/DRESSINGS) ×2 IMPLANT
DRSG OPSITE POSTOP 4X12 (GAUZE/BANDAGES/DRESSINGS) IMPLANT
DURAPREP 26ML APPLICATOR (WOUND CARE) ×2 IMPLANT
ELECT LOOP LEEP RND 20X12 WHT (CUTTING LOOP)
ELECTRODE LOOP LP RND 20X12WHT (CUTTING LOOP) ×2 IMPLANT
GAUZE 4X4 16PLY ~~LOC~~+RFID DBL (SPONGE) ×2 IMPLANT
GAUZE PAD ABD 8X10 STRL (GAUZE/BANDAGES/DRESSINGS) ×2 IMPLANT
GAUZE SPONGE 4X4 12PLY STRL (GAUZE/BANDAGES/DRESSINGS) ×2 IMPLANT
GLOVE BIO SURGEON STRL SZ7 (GLOVE) ×2 IMPLANT
GLOVE SURG UNDER POLY LF SZ7 (GLOVE) ×6 IMPLANT
GOWN STRL REUS W/ TWL LRG LVL3 (GOWN DISPOSABLE) ×4 IMPLANT
GOWN STRL REUS W/ TWL XL LVL3 (GOWN DISPOSABLE) ×2 IMPLANT
GOWN STRL REUS W/TWL LRG LVL3 (GOWN DISPOSABLE) ×4
GOWN STRL REUS W/TWL XL LVL3 (GOWN DISPOSABLE) ×2
HEMOSTAT ARISTA ABSORB 3G PWDR (HEMOSTASIS) IMPLANT
HIBICLENS CHG 4% 4OZ BTL (MISCELLANEOUS) ×2 IMPLANT
KIT TURNOVER KIT B (KITS) ×2 IMPLANT
LIGASURE IMPACT 36 18CM CVD LR (INSTRUMENTS) IMPLANT
NEEDLE HYPO 22GX1.5 SAFETY (NEEDLE) ×2 IMPLANT
NS IRRIG 1000ML POUR BTL (IV SOLUTION) ×2 IMPLANT
PACK ABDOMINAL GYN (CUSTOM PROCEDURE TRAY) ×2 IMPLANT
PAD ARMBOARD 7.5X6 YLW CONV (MISCELLANEOUS) ×2 IMPLANT
PAD OB MATERNITY 4.3X12.25 (PERSONAL CARE ITEMS) ×2 IMPLANT
PENCIL SMOKE EVAC W/HOLSTER (ELECTROSURGICAL) ×2 IMPLANT
SPIKE FLUID TRANSFER (MISCELLANEOUS) IMPLANT
SPONGE T-LAP 18X18 ~~LOC~~+RFID (SPONGE) ×4 IMPLANT
STRIP CLOSURE SKIN 1/2X4 (GAUZE/BANDAGES/DRESSINGS) IMPLANT
SUT VIC AB 0 CT1 18XCR BRD8 (SUTURE) ×6 IMPLANT
SUT VIC AB 0 CT1 27 (SUTURE) ×4
SUT VIC AB 0 CT1 27XBRD ANBCTR (SUTURE) ×4 IMPLANT
SUT VIC AB 0 CT1 36 (SUTURE) ×2 IMPLANT
SUT VIC AB 0 CT1 8-18 (SUTURE) ×8
SUT VIC AB 3-0 CT1 27 (SUTURE) ×2
SUT VIC AB 3-0 CT1 TAPERPNT 27 (SUTURE) ×2 IMPLANT
SUT VIC AB 3-0 SH 27 (SUTURE) ×2
SUT VIC AB 3-0 SH 27X BRD (SUTURE) IMPLANT
SUT VIC AB 4-0 KS 27 (SUTURE) IMPLANT
SUT VICRYL 0 TIES 12 18 (SUTURE) ×2 IMPLANT
SYR CONTROL 10ML LL (SYRINGE) ×2 IMPLANT
TOWEL GREEN STERILE FF (TOWEL DISPOSABLE) ×4 IMPLANT
TRAY FOLEY W/BAG SLVR 14FR (SET/KITS/TRAYS/PACK) ×2 IMPLANT

## 2022-08-09 NOTE — Transfer of Care (Signed)
Immediate Anesthesia Transfer of Care Note  Patient: Alicia Larson  Procedure(s) Performed: HYSTERECTOMY ABDOMINAL OPEN BILATERAL SALPINGECTOMY  Patient Location: PACU  Anesthesia Type:GA combined with regional for post-op pain  Level of Consciousness: drowsy and patient cooperative  Airway & Oxygen Therapy: Patient Spontanous Breathing and Patient connected to face mask oxygen  Post-op Assessment: Report given to RN and Post -op Vital signs reviewed and stable  Post vital signs: Reviewed and stable  Last Vitals:  Vitals Value Taken Time  BP 151/95 08/09/22 1705  Temp 36.4 C 08/09/22 1705  Pulse 84 08/09/22 1711  Resp 17 08/09/22 1711  SpO2 100 % 08/09/22 1711  Vitals shown include unvalidated device data.  Last Pain:  Vitals:   08/09/22 1705  TempSrc:   PainSc: 0-No pain         Complications: No notable events documented.

## 2022-08-09 NOTE — Anesthesia Postprocedure Evaluation (Signed)
Anesthesia Post Note  Patient: Alicia Larson  Procedure(s) Performed: HYSTERECTOMY ABDOMINAL OPEN BILATERAL SALPINGECTOMY     Patient location during evaluation: PACU Anesthesia Type: Regional and General Level of consciousness: awake Pain management: pain level controlled Vital Signs Assessment: post-procedure vital signs reviewed and stable Respiratory status: spontaneous breathing, nonlabored ventilation, respiratory function stable and patient connected to nasal cannula oxygen Cardiovascular status: blood pressure returned to baseline and stable Postop Assessment: no apparent nausea or vomiting Anesthetic complications: no   No notable events documented.  Last Vitals:  Vitals:   08/09/22 1830 08/09/22 2028  BP: (!) 153/107 (!) 127/92  Pulse: 77 89  Resp: 15 17  Temp: 36.4 C 36.9 C  SpO2: 100% 100%    Last Pain:  Vitals:   08/09/22 2028  TempSrc: Oral  PainSc:                  Karyl Kinnier Aimee Heldman

## 2022-08-09 NOTE — Anesthesia Procedure Notes (Signed)
Procedure Name: Intubation Date/Time: 08/09/2022 3:15 PM  Performed by: Renato Shin, CRNAPre-anesthesia Checklist: Patient identified, Emergency Drugs available, Suction available and Patient being monitored Patient Re-evaluated:Patient Re-evaluated prior to induction Oxygen Delivery Method: Circle system utilized Preoxygenation: Pre-oxygenation with 100% oxygen Induction Type: IV induction Ventilation: Mask ventilation without difficulty Laryngoscope Size: Miller and 2 Grade View: Grade I Tube type: Oral Tube size: 7.0 mm Number of attempts: 1 Airway Equipment and Method: Stylet and Oral airway Placement Confirmation: ETT inserted through vocal cords under direct vision, positive ETCO2 and breath sounds checked- equal and bilateral Secured at: 21 cm Tube secured with: Tape Dental Injury: Teeth and Oropharynx as per pre-operative assessment

## 2022-08-09 NOTE — Brief Op Note (Signed)
08/09/2022  5:06 PM  PATIENT:  Alicia Larson  36 y.o. female  PRE-OPERATIVE DIAGNOSIS:  Fibroids Hydronephrosis  POST-OPERATIVE DIAGNOSIS:  Fibroids Hydronephrosis  PROCEDURE:  Procedure(s) with comments: HYSTERECTOMY ABDOMINAL (N/A) - TAP BLOCK OPEN BILATERAL SALPINGECTOMY  SURGEON:  Surgeon(s) and Role:    * Lavonia Drafts, MD - Primary    * Donnamae Jude, MD - Assisting    * Darliss Cheney, MD - Assisting  ANESTHESIA:   local, regional, and general  EBL:  500 mL   BLOOD ADMINISTERED:none  DRAINS: none   LOCAL MEDICATIONS USED:  MARCAINE    and LIDOCAINE   SPECIMEN:  Source of Specimen:  uterus, bilateral fallopian tubes and cervix  DISPOSITION OF SPECIMEN:  PATHOLOGY  COUNTS:  YES  TOURNIQUET:  * No tourniquets in log *  DICTATION: .Note written in EPIC  PLAN OF CARE: Admit to inpatient   PATIENT DISPOSITION:  PACU - hemodynamically stable.   Delay start of Pharmacological VTE agent (>24hrs) due to surgical blood loss or risk of bleeding: yes  Complications: none immediate  Amirr Achord L. Harraway-Smith, M.D., [facility]

## 2022-08-09 NOTE — Anesthesia Procedure Notes (Signed)
Anesthesia Regional Block: TAP block   Pre-Anesthetic Checklist: , timeout performed,  Correct Patient, Correct Site, Correct Laterality,  Correct Procedure, Correct Position, site marked,  Risks and benefits discussed,  Surgical consent,  Pre-op evaluation,  At surgeon's request and post-op pain management  Laterality: Left  Prep: chloraprep       Needles:  Injection technique: Single-shot  Needle Type: Echogenic Stimulator Needle     Needle Length: 10cm  Needle Gauge: 20     Additional Needles:   Procedures:,,,, ultrasound used (permanent image in chart),,    Narrative:  Start time: 08/09/2022 2:00 PM End time: 08/09/2022 2:10 PM Injection made incrementally with aspirations every 5 mL.  Performed by: Personally  Anesthesiologist: Murvin Natal, MD  Additional Notes: Functioning IV was confirmed and monitors were applied.  A timeout was performed. Sterile prep, hand hygiene and sterile gloves were used. A 120m 20ga Bbraun echogenic stimulator needle was used. Negative aspiration and negative test dose prior to incremental administration of local anesthetic. The patient tolerated the procedure well.  Ultrasound guidance: relevent anatomy identified, needle position confirmed, local anesthetic spread visualized around nerve(s), vascular puncture avoided.  Image printed for medical record.

## 2022-08-09 NOTE — Op Note (Signed)
0/17/2023  5:06 PM  PATIENT:  Alicia Larson  36 y.o. female  PRE-OPERATIVE DIAGNOSIS:  Fibroids Hydronephrosis  POST-OPERATIVE DIAGNOSIS:  Fibroids Hydronephrosis  PROCEDURE:  Procedure(s) with comments: HYSTERECTOMY ABDOMINAL (N/A) - TAP BLOCK OPEN BILATERAL SALPINGECTOMY  SURGEON:  Surgeon(s) and Role:    * Lavonia Drafts, MD - Primary    * Donnamae Jude, MD - Assisting    * Darliss Cheney, MD - Assisting  ANESTHESIA:   local, regional, and general  EBL:  500 mL   BLOOD ADMINISTERED:none  DRAINS: none   LOCAL MEDICATIONS USED:  MARCAINE    and LIDOCAINE   SPECIMEN:  Source of Specimen:  uterus, bilateral fallopian tubes and cervix  DISPOSITION OF SPECIMEN:  PATHOLOGY  COUNTS:  YES  TOURNIQUET:  * No tourniquets in log *  DICTATION: .Note written in EPIC  PLAN OF CARE: Admit to inpatient   PATIENT DISPOSITION:  PACU - hemodynamically stable.   Delay start of Pharmacological VTE agent (>24hrs) due to surgical blood loss or risk of bleeding: yes  Complications: none immediate   INDICATIONS: The patient is a 36y.o. G0 with the aforementioned diagnoses who desires definitive surgical management. On the preoperative visit, the risks, benefits, indications, and alternatives of the procedure were reviewed with the patient. On the day of surgery, the risks of surgery were again discussed with the patient including but not limited to: bleeding which may require transfusion or reoperation; infection which may require antibiotics; injury to bowel, bladder, ureters or other surrounding organs; need for additional procedures; thromboembolic phenomenon, incisional problems and other postoperative/anesthesia complications. Written informed consent was obtained.  OPERATIVE FINDINGS: A 20 week sized fibroids uterus with on e very large posterior fibroids and multiple smaller fiboids with normal tubes and ovaries bilaterally.  SPECIMENS: Uterus,cervix, bilateral  fallopian tubes sent to pathology  COMPLICATIONS: none.  DESCRIPTION OF PROCEDURE: The patient received intravenous antibiotics and had sequential compression devices applied to her lower extremities while in the preoperative area. She was taken to the operating room and placed under general anesthesia without difficulty. The abdomen and perineum were prepped and draped in a sterile manner, and she was placed in a dorsal supine position. A Foley catheter was inserted into the bladder and attached to constant drainage. After an adequate timeout was performed, a transverse skin incision was made. This incision was taken down to the fascia using a scalpel and cautery with care given to maintain good hemostasis. The fascia was grasped with kocher clamps, tented up and the rectus muscles were dissected off using sharp and blunt dissection on the superior and inferior aspects of the fascial incision. The peritoneum entered sharply without complication. This peritoneal cavity was entered digitally. Attention was then turned to the pelvis. The uterus at this point was noted to be mobilized and was delivered up out of the abdomen. The bowel was packed away with moist laparotomy sponges. The round ligaments on each side were clamped, suture ligated with 0 Vicryl, and transected with electrocautery allowing entry into the broad ligament. Of note, all sutures used in this procedure are 0 Vicryl unless otherwise noted. The anterior and posterior leaves of the broad ligament were separated, and the ureters were inspected to be safely away from the area of dissection bilaterally. The uteroovarian ligaments were bliaterally clamped and doubly suture ligated. The uterine arteries were skeletinized bilaterally A bladder flap was then created. The bladder was then bluntly dissected off the lower uterine segment and cervix with good hemostasis noted.  The uterine arteries were then skeletonized bilaterally and then clamped, cut, and  doubly suture ligated with care given to prevent ureteral injury. After the uterine arteries were clamped, the uterus was removed with a scalpel and the cervical stump was grasped with a Jacob's tenaculum. The uterosacral ligaments were then clamped, cut, and ligated bilaterally. Finally, the cardinal ligaments were clamped, cut, and ligated bilaterally. The uterus was removed.  The cuff angles were closed with multiple figure-of-eight sutures.   At this point the fallopian tubes were grasped with a Babcock clamp.  Using a kelly clamp the mesosalpinx was clamped cut and suture ligated, excellent hemostasis was noted.  The pelvis was irrigated and hemostasis was reconfirmed at all pedicles and along the pelvic sidewall. The ureters were inspected and noted to be peristalsing bilaterally. A cystoscopy was performed using normal saline as the distending medium.  Indigo carmine was given by anesthesia via IV and both ureters were found to be patent. All laparotomy sponges and instruments were removed from the abdomen. Surgicel was placed over the vaginal cuff and the pedicles after the pelvis was irrigated.  The peritoneum was reapproximated with 0 vicryl.  The fascia was closed with 0 vicryl . The subcutaneous fascia was closed with 3-0 vicryl and the skin was a subcuticular suture of 3-0 vicryl. The incision was injected with 30cc of 0.25% Marcaine. Benzoin and steristrips were applied.  Sponge, lap and needle counts were correct times two. The patient was taken to the recovery area awake, extubated and in stable condition.   An experienced assistant was required given the standard of surgical care given the complexity of the case.  This assistant was needed for exposure, dissection, suctioning, retraction, instrument exchange, and for overall help during the procedure.  Hazely Sealey L. Harraway-Smith, M.D., [facility]

## 2022-08-09 NOTE — Anesthesia Procedure Notes (Signed)
Anesthesia Regional Block: TAP block   Pre-Anesthetic Checklist: , timeout performed,  Correct Patient, Correct Site, Correct Laterality,  Correct Procedure, Correct Position, site marked,  Risks and benefits discussed,  Surgical consent,  Pre-op evaluation,  At surgeon's request and post-op pain management  Laterality: Right  Prep: chloraprep       Needles:  Injection technique: Single-shot  Needle Type: Echogenic Stimulator Needle     Needle Length: 10cm  Needle Gauge: 20     Additional Needles:   Procedures:,,,, ultrasound used (permanent image in chart),,    Narrative:  Start time: 08/09/2022 1:50 PM End time: 08/09/2022 2:00 PM Injection made incrementally with aspirations every 5 mL.  Performed by: Personally  Anesthesiologist: Murvin Natal, MD  Additional Notes: Functioning IV was confirmed and monitors were applied.  A timeout was performed. Sterile prep, hand hygiene and sterile gloves were used. A 129m 20ga Bbraun echogenic stimulator needle was used. Negative aspiration and negative test dose prior to incremental administration of local anesthetic. The patient tolerated the procedure well.  Ultrasound guidance: relevent anatomy identified, needle position confirmed, local anesthetic spread visualized around nerve(s), vascular puncture avoided.  Image printed for medical record.

## 2022-08-09 NOTE — H&P (Signed)
Preoperative History and Physical  Alicia Larson is a 36 y.o. G0P0000 here for surgical management of large uterine fibroids. Failed all conservative measures. Understands that her fertility will be eliminated once surgery occurs.   Proposed surgery: Total abdominal hysterectomy with bilateral salpingectomy.    Past Medical History:  Diagnosis Date   Anemia    Dermatitis 05/16/2013   02/28/2013. [facility]. [place] PAC.  Dermatitis-Nummular (arms,legs,buttocks) Treatment Plan: Triamincinolone 0.1% Cream apply twice daily. Tinea versicolor (back x) Treatment Plan: Selenium Sulfide Wash twice weekly. Ketoconazole Cream aaa bid x 3 wks.    Elevated blood protein 01/25/2018   Fibroids 09/16/2015   Hydronephrosis of right kidney    due to fibroids   Hypertension    Menorrhagia 09/03/2015   Past Surgical History:  Procedure Laterality Date   WISDOM TOOTH EXTRACTION  2008   OB History     Gravida  0   Para  0   Term  0   Preterm  0   AB  0   Living         SAB  0   IAB  0   Ectopic  0   Multiple      Live Births             Patient denies any cervical dysplasia or STIs. Medications Prior to Admission  Medication Sig Dispense Refill Last Dose   losartan (COZAAR) 25 MG tablet Take 1 tablet (25 mg total) by mouth daily. 90 tablet 3 08/08/2022   Relugolix-Estradiol-Norethind (MYFEMBREE) 40-1-0.5 MG TABS Take 1 tablet by mouth daily. 30 tablet 3 Past Month   ibuprofen (ADVIL) 200 MG tablet Take 600-800 mg by mouth every 6 (six) hours as needed for moderate pain.   More than a month    No Known Allergies Social History:   reports that she has never smoked. She has never used smokeless tobacco. She reports current alcohol use. She reports that she does not use drugs. Family History  Problem Relation Age of Onset   Diabetes Mother    Hypertension Mother    Hypertension Maternal Grandmother    Hyperlipidemia Maternal  Grandfather    Hypertension Maternal Grandfather    Heart disease Maternal Grandfather    Leukemia Maternal Uncle     Review of Systems: Noncontributory  PHYSICAL EXAM: Blood pressure (!) 160/99, pulse 97, temperature 98.2 F (36.8 C), temperature source Oral, resp. rate 18, height '5\' 7"'$  (1.702 m), weight 78.5 kg, last menstrual period 05/16/2022, SpO2 100 %. General appearance - alert, well appearing, and in no distress Chest - clear to auscultation, no wheezes, rales or rhonchi, symmetric air entry Heart - normal rate and regular rhythm Abdomen - soft, nontender, nondistended, no masses or organomegaly. Fibroids palpated at 4 cm above umbilicus  Pelvic - examination not repeated today. Extremities - peripheral pulses normal, no pedal edema, no clubbing or cyanosis  Labs: Results for orders placed or performed during the hospital encounter of 08/09/22 (from the past 336 hour(s))  Type and screen [place]   Collection Time: 08/09/22 12:35 PM  Result Value Ref Range   ABO/RH(D) O POS    Antibody Screen NEG    Sample Expiration      08/12/2022,2359 Performed at [facility] [address], [place], [place] [address]   Pregnancy, urine POC   Collection Time: 08/09/22 12:43 PM  Result Value Ref Range   Preg Test, Ur NEGATIVE NEGATIVE  Imaging Studies: No results found.  Assessment: Patient Active Problem List   Diagnosis Date Noted   Fibroids, intramural 08/09/2022   Iron deficiency anemia 01/25/2018   Fibroids 09/16/2015   Contraception, device intrauterine 03/29/2012    Plan: Patient will undergo surgical management with Total hysterectomy with bilateral salpingectomy.  The risks of surgery were discussed in detail with the patient including but not limited to: bleeding which may require transfusion or reoperation; infection which may require antibiotics; injury to surrounding organs which may involve bowel, bladder, ureters ; need for  additional procedures including laparoscopy or laparotomy; thromboembolic phenomenon, surgical site problems and other postoperative/anesthesia complications. Likelihood of success in alleviating the patient's condition was discussed. Routine postoperative instructions will be reviewed with the patient and her family in detail after surgery.  The patient concurred with the proposed plan, giving informed written consent for the surgery.  Patient has been NPO since last night she will remain NPO for procedure.  Anesthesia and OR aware.  Preoperative prophylactic antibiotics and SCDs ordered on call to the OR.  To OR when ready.  Charline Hoskinson L. Ihor Dow, M.D., [facility] 08/09/2022 2:04 PM

## 2022-08-10 ENCOUNTER — Encounter (HOSPITAL_COMMUNITY): Payer: Self-pay | Admitting: Obstetrics & Gynecology

## 2022-08-10 ENCOUNTER — Encounter (INDEPENDENT_AMBULATORY_CARE_PROVIDER_SITE_OTHER): Payer: 59 | Admitting: Obstetrics & Gynecology

## 2022-08-10 ENCOUNTER — Other Ambulatory Visit: Payer: Self-pay

## 2022-08-10 ENCOUNTER — Other Ambulatory Visit (HOSPITAL_COMMUNITY): Payer: Self-pay

## 2022-08-10 LAB — CBC
HCT: 30.5 % — ABNORMAL LOW (ref 36.0–46.0)
Hemoglobin: 9.6 g/dL — ABNORMAL LOW (ref 12.0–15.0)
MCH: 24.1 pg — ABNORMAL LOW (ref 26.0–34.0)
MCHC: 31.5 g/dL (ref 30.0–36.0)
MCV: 76.6 fL — ABNORMAL LOW (ref 80.0–100.0)
Platelets: 261 10*3/uL (ref 150–400)
RBC: 3.98 MIL/uL (ref 3.87–5.11)
RDW: 13.9 % (ref 11.5–15.5)
WBC: 8.3 10*3/uL (ref 4.0–10.5)
nRBC: 0 % (ref 0.0–0.2)

## 2022-08-10 MED ORDER — IBUPROFEN 600 MG PO TABS
600.0000 mg | ORAL_TABLET | Freq: Four times a day (QID) | ORAL | 2 refills | Status: DC | PRN
  Filled 2022-08-10: qty 30, 8d supply, fill #0

## 2022-08-10 MED ORDER — OXYCODONE-ACETAMINOPHEN 5-325 MG PO TABS
1.0000 | ORAL_TABLET | Freq: Four times a day (QID) | ORAL | 0 refills | Status: DC | PRN
  Filled 2022-08-10: qty 20, 5d supply, fill #0

## 2022-08-10 MED ORDER — DOCUSATE SODIUM 100 MG PO CAPS
100.0000 mg | ORAL_CAPSULE | Freq: Two times a day (BID) | ORAL | 0 refills | Status: DC
  Filled 2022-08-10: qty 100, 50d supply, fill #0

## 2022-08-10 NOTE — Care Management (Signed)
  Transition of Care [facility]) Screening Note   Patient Details  Name: Alicia Larson Date of Birth: 06-30-1986   [facility] of [facility]) CM/SW Contact:    Carles Collet, RN Phone Number: 08/10/2022, 8:19 AM    Transition of Care Department [facility]) has reviewed patient and no TOC needs have been identified at this time. We will continue to monitor patient advancement through interdisciplinary progression rounds. If new patient transition needs arise, please place a TOC consult.

## 2022-08-10 NOTE — Progress Notes (Addendum)
1 Day Post-Op Procedure(s) (LRB): HYSTERECTOMY ABDOMINAL (N/A) OPEN BILATERAL SALPINGECTOMY  Subjective: Patient reports tolerating PO and no problems voiding.  Pt has walked 3 laps in the halls with her mother. She reports no pain at all. Pt denies pain. She has not passed gas but, feels like she needs to.    Objective: I have reviewed patient's vital signs, intake and output, medications, and labs. BP 118/67 (BP Location: Right Arm)   Pulse 68   Temp 97.9 F (36.6 C) (Oral)   Resp 16   Ht '5\' 7"'$  (1.702 m)   Wt 78.5 kg   LMP 05/16/2022 (Approximate) Comment: menstrual changed when pt started taking Myfembree  SpO2 100%   BMI 27.10 kg/m   I/O last 3 completed shifts: In: 3358 [I.V.:2758; IV Piggyback:600] Out: 1600 [Urine:1100; Blood:500] No intake/output data recorded.  Pt is sitting up in the bed smiling and looking healthy and happy! General: alert and no distress Resp: clear to auscultation bilaterally Cardio: regular rate and rhythm, S1, S2 normal, no murmur, click, rub or gallop GI: soft, non-tender; bowel sounds normal; no masses,  no organomegaly Extremities: extremities normal, atraumatic, no cyanosis or edema Vaginal Bleeding: minimal    Latest Ref Rng & Units 08/10/2022    2:43 AM 08/09/2022    4:15 PM 07/18/2022   10:23 AM  CBC  WBC 4.0 - 10.5 K/uL 8.3   5.3   Hemoglobin 12.0 - 15.0 g/dL 9.6  11.9  13.3   Hematocrit 36.0 - 46.0 % 30.5  35.0  42.6   Platelets 150 - 400 K/uL 261   275     Assessment: s/p Procedure(s) with comments: HYSTERECTOMY ABDOMINAL (N/A) - TAP BLOCK OPEN BILATERAL SALPINGECTOMY: stable, progressing well, and tolerating diet. Feels ready to go home but, knows that she needs to pass flatus first.   Plan: Encourage ambulation May discharge to home later today once she passes flatus.  Discharge instructions reviewed with pt and her mother  All questions answered.   LOS: 1 day    Lavonia Drafts, MD 08/10/2022, 9:36  AM

## 2022-08-10 NOTE — Discharge Summary (Signed)
Physician Discharge Summary  Patient ID: Alicia Larson MRN: [id number] DOB/AGE: 1986/09/10 36 y.o.  Admit date: 08/09/2022 Discharge date: 08/10/2022  Admission Diagnoses: Uterine fibroids hydronephrosis  Discharge Diagnoses:  Principal Problem:   Fibroids, intramural Active Problems:   Status post hysterectomy   Post-operative state   Discharged Condition: good  Hospital Course: .Patient had an uncomplicated surgery; for further details of this surgery, please refer to the operative note. Furthermore, the patient had an uncomplicated postoperative course.  By time of discharge, her pain was controlled on oral pain medications; she was ambulating, voiding without difficulty, tolerating regular diet and passing flatus.  She was deemed stable for discharge to home.     Consults: None  Significant Diagnostic Studies: labs: CBC  Treatments: surgery: Total Abdominal hysterectomy with bilateral salpingectomy .   Discharge Exam: Blood pressure 118/67, pulse 68, temperature 97.9 F (36.6 C), temperature source Oral, resp. rate 16, height '5\' 7"'$  (1.702 m), weight 78.5 kg, last menstrual period 05/16/2022, SpO2 100 %. General appearance: alert and no distress GI: soft, non-tender; bowel sounds normal; no masses,  no organomegaly Incision: C/D/I  small about of died blood on dressing.     Latest Ref Rng & Units 08/10/2022    2:43 AM 08/09/2022    4:15 PM 07/18/2022   10:23 AM  CBC  WBC 4.0 - 10.5 K/uL 8.3   5.3   Hemoglobin 12.0 - 15.0 g/dL 9.6  11.9  13.3   Hematocrit 36.0 - 46.0 % 30.5  35.0  42.6   Platelets 150 - 400 K/uL 261   275     Disposition: Discharge disposition: 01-Home or Self Care       Discharge Instructions     Call MD for:  difficulty breathing, headache or visual disturbances   Complete by: As directed    Call MD for:  extreme fatigue   Complete by: As directed    Call MD for:  hives   Complete by: As directed    Call MD for:  persistant  dizziness or light-headedness   Complete by: As directed    Call MD for:  persistant nausea and vomiting   Complete by: As directed    Call MD for:  redness, tenderness, or signs of infection (pain, swelling, redness, odor or green/yellow discharge around incision site)   Complete by: As directed    Call MD for:  severe uncontrolled pain   Complete by: As directed    Call MD for:  temperature >100.4   Complete by: As directed    Diet - low sodium heart healthy   Complete by: As directed    Discharge wound care:   Complete by: As directed    Remove outer honey comb dressing in 6 days.   Driving Restrictions   Complete by: As directed    No driving for 2 weeks.   Increase activity slowly   Complete by: As directed    Lifting restrictions   Complete by: As directed    No lifting for 6 weeks   Sexual Activity Restrictions   Complete by: As directed    Nothing in vagina for 6 weeks      Allergies as of 08/10/2022   No Known Allergies      Medication List     STOP taking these medications    Myfembree 40-1-0.5 MG Tabs Generic drug: Relugolix-Estradiol-Norethind       TAKE these medications    docusate sodium 100 MG capsule Commonly known as:  COLACE Take 1 capsule (100 mg total) by mouth 2 (two) times daily.   ibuprofen 600 MG tablet Commonly known as: ADVIL Take 1 tablet (600 mg total) by mouth every 6 (six) hours as needed for moderate pain. What changed:  medication strength how much to take   losartan 25 MG tablet Commonly known as: COZAAR Take 1 tablet (25 mg total) by mouth daily.   oxyCODONE-acetaminophen 5-325 MG tablet Commonly known as: PERCOCET/ROXICET Take 1 tablet by mouth every 6 (six) hours as needed for severe pain.               Discharge Care Instructions  (From admission, onward)           Start     Ordered   08/10/22 0000  Discharge wound care:       Comments: Remove outer honey comb dressing in 6 days.   08/10/22 [place] Follow up in 2 week(s).   Specialty: Internal Medicine Contact information: [address] [place] [phone]                Signed: Lavonia Drafts 08/10/2022, 1:52 PM

## 2022-08-10 NOTE — Progress Notes (Signed)
AVS given and explained to patient and family, abdominal binder in place.

## 2022-08-10 NOTE — Plan of Care (Signed)
  Problem: Education: Goal: Knowledge of General Education information will improve Description: Including pain rating scale, medication(s)/side effects and non-pharmacologic comfort measures Outcome: Adequate for Discharge   Problem: Health Behavior/Discharge Planning: Goal: Ability to manage health-related needs will improve Outcome: Adequate for Discharge   Problem: Clinical Measurements: Goal: Ability to maintain clinical measurements within normal limits will improve Outcome: Adequate for Discharge Goal: Will remain free from infection Outcome: Adequate for Discharge Goal: Diagnostic test results will improve Outcome: Adequate for Discharge Goal: Respiratory complications will improve Outcome: Adequate for Discharge Goal: Cardiovascular complication will be avoided Outcome: Adequate for Discharge   Problem: Activity: Goal: Risk for activity intolerance will decrease Outcome: Adequate for Discharge   Problem: Nutrition: Goal: Adequate nutrition will be maintained Outcome: Adequate for Discharge   Problem: Coping: Goal: Level of anxiety will decrease Outcome: Adequate for Discharge   Problem: Elimination: Goal: Will not experience complications related to bowel motility Outcome: Adequate for Discharge Goal: Will not experience complications related to urinary retention Outcome: Adequate for Discharge   Problem: Pain Managment: Goal: General experience of comfort will improve Outcome: Adequate for Discharge   Problem: Safety: Goal: Ability to remain free from injury will improve Outcome: Adequate for Discharge   Problem: Skin Integrity: Goal: Risk for impaired skin integrity will decrease Outcome: Adequate for Discharge   Problem: Education: Goal: Knowledge of the prescribed therapeutic regimen will improve Outcome: Adequate for Discharge Goal: Understanding of sexual limitations or changes related to disease process or condition will improve Outcome: Adequate  for Discharge Goal: Individualized Educational Video(s) Outcome: Adequate for Discharge   Problem: Self-Concept: Goal: Communication of feelings regarding changes in body function or appearance will improve Outcome: Adequate for Discharge   Problem: Skin Integrity: Goal: Demonstration of wound healing without infection will improve Outcome: Adequate for Discharge   

## 2022-08-11 LAB — SURGICAL PATHOLOGY

## 2022-08-22 ENCOUNTER — Other Ambulatory Visit (HOSPITAL_COMMUNITY): Payer: 59

## 2022-08-24 ENCOUNTER — Ambulatory Visit (INDEPENDENT_AMBULATORY_CARE_PROVIDER_SITE_OTHER): Payer: 59 | Admitting: Obstetrics & Gynecology

## 2022-08-24 ENCOUNTER — Encounter: Payer: Self-pay | Admitting: Obstetrics & Gynecology

## 2022-08-24 VITALS — BP 145/82 | HR 89 | Wt 167.0 lb

## 2022-08-24 DIAGNOSIS — Z9889 Other specified postprocedural states: Secondary | ICD-10-CM

## 2022-08-24 NOTE — Progress Notes (Signed)
History:  36 y.o.LMP here today for 2 week post op check.Pt is s/p Abd hysterectomy with bilateral salpingectomy on 08/09/2022.  Pt reports that she is doing well. She is eating well. She has been having some intermittent pain that is relieved with passing gas. She is passing stools without difficulty but, it only every 2-3 days.   She reports occ pink tinged discharge. No bleeding. No odor. No unexpected pain.     The following portions of the patient's history were reviewed and updated as appropriate: allergies, current medications, past family history, past medical history, past social history, past surgical history and problem list.  Review of Systems:  Pertinent items are noted in HPI.    Objective:  Physical Exam There were no vitals taken for this visit.  CONSTITUTIONAL: Well-developed, well-nourished female in no acute distress.  HENT:  Normocephalic, atraumatic EYES: Conjunctivae and EOM are normal. No scleral icterus.  NECK: Normal range of motion SKIN: Skin is warm and dry. No rash noted. Not diaphoretic.No pallor. [place]: Alert and oriented to person, place, and time. Normal coordination.  Abd: Soft, nontender and nondistended; incision healing well and intact. Steristrips removed.   Pelvic: deferred  Labs and Imaging Surg path10/17/2023 FINAL MICROSCOPIC DIAGNOSIS:   A. UTERUS, CERVIX, AND BILATERAL FALLOPIAN TUBES, HYSTERECTOMY:  -  Unremarkable cervix, negative for dysplasia.  -  Attenuated endometrium, negative for hyperplasia/atypia.  -  Myometrium with numerous leiomyomata, see note.  -  Unremarkable bilateral fallopian tubes   Note: In some of the larger leiomyomas there is evidence of necrosis,  but without evidence of cytologic atypia or increased mitoses.  The  differentiation between coagulative tumor cell necrosis versus early  infarct type necrosis can be difficult, but given the lack of nuclear  pleomorphism and increased mitoses and the presence of  calcifications  consistent with longstanding presence of the leiomyomata ischemic  necrosis is favored, consistent with benign leiomyomata as opposed to a  smooth muscle tumor of uncertain malignant potential (STUMP).  Dr.  Saralyn Pilar has peer reviewed the case and agrees with the interpretation.    Assessment & Plan:  2 week post op check following Abd hysterectomy with bilateral salpingectomy on 08/09/2022  Doing well  Reviewed her surg path.   Reviewed post op instructions and activities  Gradual increase in activities  F/u in 4 weeks or sooner prn  Reviewed no intercourse for 6 weeks post op  Miralax prn   All questions answered.   Akyia Borelli L. Harraway-Smith, M.D., Cherlynn June

## 2022-08-24 NOTE — Progress Notes (Signed)
Patient complaining of a lot of vaginal rectal pressure. Patient states that she also notice some vaginal discharge that started yesterday. Kathrene Alu RN

## 2022-08-30 ENCOUNTER — Encounter: Payer: Self-pay | Admitting: General Practice

## 2022-08-30 DIAGNOSIS — D500 Iron deficiency anemia secondary to blood loss (chronic): Secondary | ICD-10-CM

## 2022-08-30 DIAGNOSIS — D219 Benign neoplasm of connective and other soft tissue, unspecified: Secondary | ICD-10-CM

## 2022-09-01 ENCOUNTER — Encounter: Payer: Self-pay | Admitting: General Practice

## 2022-09-07 ENCOUNTER — Encounter: Payer: 59 | Admitting: Obstetrics & Gynecology

## 2022-09-14 ENCOUNTER — Ambulatory Visit (INDEPENDENT_AMBULATORY_CARE_PROVIDER_SITE_OTHER): Payer: 59 | Admitting: Obstetrics & Gynecology

## 2022-09-14 ENCOUNTER — Encounter: Payer: Self-pay | Admitting: Obstetrics & Gynecology

## 2022-09-14 VITALS — BP 140/98 | HR 88 | Wt 164.0 lb

## 2022-09-14 DIAGNOSIS — N1330 Unspecified hydronephrosis: Secondary | ICD-10-CM

## 2022-09-14 DIAGNOSIS — I10 Essential (primary) hypertension: Secondary | ICD-10-CM

## 2022-09-14 DIAGNOSIS — Z9889 Other specified postprocedural states: Secondary | ICD-10-CM

## 2022-09-14 NOTE — Progress Notes (Signed)
History:  36 y.o.LMP here today for 6 week post op check.Pt is s/p RATH with bilateral salpingectomy on 08/09/2022.  Pt reports that she is doing well. She is eating and passing stools without difficulty.  She still occ has pain on the right side where she had hydronephrosis pre-surgery. It is not worse and 'may feel a little better' . It is interittent.    The following portions of the patient's history were reviewed and updated as appropriate: allergies, current medications, past family history, past medical history, past social history, past surgical history and problem list.  Review of Systems:  Pertinent items are noted in HPI.    Objective:  Physical Exam BP (!) 149/91   Pulse 86   Wt 164 lb (74.4 kg)   LMP 05/16/2022 (Approximate) Comment: menstrual changed when pt started taking Myfembree  BMI 25.69 kg/m   CONSTITUTIONAL: Well-developed, well-nourished female in no acute distress.  HENT:  Normocephalic, atraumatic EYES: Conjunctivae and EOM are normal. No scleral icterus.  NECK: Normal range of motion SKIN: Skin is warm and dry. No rash noted. Not diaphoretic.No pallor. [place]: Alert and oriented to person, place, and time. Normal coordination.  Abd: Soft, nontender and nondistended; her port sites are healing well. .  Pelvic:  EGBUS: no lesions Vagina: no blood in vault; cuff well healed Cervix: surgically absent Adnexa: no masses; non tender     Labs and Imaging Surg path 08/09/2022   FINAL MICROSCOPIC DIAGNOSIS:   A. UTERUS, CERVIX, AND BILATERAL FALLOPIAN TUBES, HYSTERECTOMY:  -  Unremarkable cervix, negative for dysplasia.  -  Attenuated endometrium, negative for hyperplasia/atypia.  -  Myometrium with numerous leiomyomata, see note.  -  Unremarkable bilateral fallopian tubes .chspre Assessment & Plan:  6 week post op check following RATH with bilateral salpingectomy.   Doing well  Reviewed her surg path.   Reviewed post op instructions and  activities  Gradual increase in activities to full activitiy level  F/u in 68month or sooner prn  If contd pain at next visit will reeval to see if hydronephrosis has resolved.    All questions answered.   HTN on meds  Recheck BP at home.   F/u with primary care provider in 2 weeks   Gage Treiber L. Harraway-Smith, M.D., FCherlynn June

## 2022-09-14 NOTE — Progress Notes (Signed)
Patient presents for 6 weeks check up s/p abdominal hysterectomy. Kathrene Alu RN

## 2022-09-27 ENCOUNTER — Encounter: Payer: Self-pay | Admitting: Physician Assistant

## 2022-09-27 ENCOUNTER — Other Ambulatory Visit: Payer: Self-pay

## 2022-09-27 ENCOUNTER — Ambulatory Visit: Payer: 59 | Admitting: Family Medicine

## 2022-09-27 VITALS — BP 148/82 | HR 83 | Temp 98.2°F | Resp 16 | Ht 67.0 in | Wt 165.8 lb

## 2022-09-27 DIAGNOSIS — I10 Essential (primary) hypertension: Secondary | ICD-10-CM | POA: Diagnosis not present

## 2022-09-27 DIAGNOSIS — E611 Iron deficiency: Secondary | ICD-10-CM

## 2022-09-27 DIAGNOSIS — E559 Vitamin D deficiency, unspecified: Secondary | ICD-10-CM | POA: Diagnosis not present

## 2022-09-27 DIAGNOSIS — Z90710 Acquired absence of both cervix and uterus: Secondary | ICD-10-CM | POA: Diagnosis not present

## 2022-09-27 DIAGNOSIS — R109 Unspecified abdominal pain: Secondary | ICD-10-CM | POA: Diagnosis not present

## 2022-09-27 DIAGNOSIS — D649 Anemia, unspecified: Secondary | ICD-10-CM | POA: Diagnosis not present

## 2022-09-27 MED ORDER — LOSARTAN POTASSIUM 50 MG PO TABS
50.0000 mg | ORAL_TABLET | Freq: Every day | ORAL | 3 refills | Status: DC
  Filled 2022-09-27: qty 90, 90d supply, fill #0

## 2022-09-27 NOTE — Progress Notes (Unsigned)
Name: Alicia Larson   MRN: [id number]    DOB: 12-17-1985   Date:09/27/2022       Progress Note  Chief Complaint  Patient presents with   Post-op Follow-up    Pt just had a hysterectomy done     Subjective:   Alicia Larson is a 36 y.o. female, presents to clinic for follow up on elevated HTN   She is taking losartan 25, BP has been elevated frequently this year  BP Readings from Last 10 Encounters:  09/27/22 (!) 148/82  09/14/22 (!) 140/98  08/24/22 (!) 145/82  08/10/22 118/67  07/18/22 (!) 153/92  04/06/22 (!) 141/87  03/15/22 128/84  05/14/21 134/84  03/09/21 (!) 142/86  01/13/20 (!) 151/89   Recent surgery - reviewed notes and post op care, she is feeling good after getting hysterectomy  Some lingering right flank/back pain that Dr. Ihor Dow says she can give a little more time to heal after surgery She has anemia - she's not sure when those labs are planned to be rechecked  Hemoglobin  Date Value Ref Range Status  08/10/2022 9.6 (L) 12.0 - 15.0 g/dL Final  08/09/2022 11.9 (L) 12.0 - 15.0 g/dL Final  07/18/2022 13.3 12.0 - 15.0 g/dL Final  03/15/2022 13.6 11.7 - 15.5 g/dL Final   Hx of iron deficiency in chart, however we did check that at her last CPE Lab Results  Component Value Date   IRON 41 03/15/2022   TIBC 377 03/15/2022   FERRITIN 21 03/15/2022        Current Outpatient Medications:    losartan (COZAAR) 25 MG tablet, Take 1 tablet (25 mg total) by mouth daily., Disp: 90 tablet, Rfl: 3   docusate sodium (COLACE) 100 MG capsule, Take 1 capsule (100 mg total) by mouth 2 (two) times daily. (Patient not taking: Reported on 09/14/2022), Disp: 10 capsule, Rfl: 0   ibuprofen (ADVIL) 600 MG tablet, Take 1 tablet (600 mg total) by mouth every 6 (six) hours as needed for moderate pain. (Patient not taking: Reported on 09/14/2022), Disp: 30 tablet, Rfl: 2   oxyCODONE-acetaminophen (PERCOCET/ROXICET) 5-325 MG tablet, Take 1 tablet by mouth every 6  (six) hours as needed for severe pain. (Patient not taking: Reported on 09/14/2022), Disp: 20 tablet, Rfl: 0  Patient Active Problem List   Diagnosis Date Noted   Fibroids, intramural 08/09/2022   Status post hysterectomy 08/09/2022   Post-operative state 08/09/2022   Iron deficiency anemia 01/25/2018   Fibroids 09/16/2015   Contraception, device intrauterine 03/29/2012    Past Surgical History:  Procedure Laterality Date   ABDOMINAL HYSTERECTOMY N/A 08/09/2022   Procedure: HYSTERECTOMY ABDOMINAL;  Surgeon: Lavonia Drafts, MD;  Location: [facility];  Service: Gynecology;  Laterality: N/A;  TAP BLOCK   BILATERAL SALPINGECTOMY  08/09/2022   Procedure: OPEN BILATERAL SALPINGECTOMY;  Surgeon: Lavonia Drafts, MD;  Location: [facility];  Service: Gynecology;;   WISDOM TOOTH EXTRACTION  2008    Family History  Problem Relation Age of Onset   Diabetes Mother    Hypertension Mother    Hypertension Maternal Grandmother    Hyperlipidemia Maternal Grandfather    Hypertension Maternal Grandfather    Heart disease Maternal Grandfather    Leukemia Maternal Uncle     Social History   Tobacco Use   Smoking status: Never   Smokeless tobacco: Never  Vaping Use   Vaping Use: Never used  Substance Use Topics   Alcohol use: Yes    Comment: ocassionally (maybe 1 drink 3-4  x a month)   Drug use: No     No Known Allergies  Health Maintenance  Topic Date Due   COVID-19 Vaccine (3 - 2023-24 season) 10/13/2022 (Originally 06/24/2022)   PAP SMEAR-Modifier  04/06/2025   DTaP/Tdap/Td (3 - Td or Tdap) 05/16/2028   INFLUENZA VACCINE  Completed   Hepatitis C Screening  Completed   HIV Screening  Completed   HPV VACCINES  Aged Out    Chart Review Today: I personally reviewed active problem list, medication list, allergies, family history, social history, health maintenance, notes from last encounter, lab results, imaging with the patient/caregiver today.   Review of Systems   Constitutional: Negative.   HENT: Negative.    Eyes: Negative.   Respiratory: Negative.    Cardiovascular: Negative.   Gastrointestinal: Negative.   Endocrine: Negative.   Genitourinary: Negative.   Musculoskeletal: Negative.   Skin: Negative.   Allergic/Immunologic: Negative.   Neurological: Negative.   Hematological: Negative.   Psychiatric/Behavioral: Negative.    All other systems reviewed and are negative.    Objective:   Vitals:   09/27/22 1013  BP: (!) 148/82  Pulse: 83  Resp: 16  Temp: 98.2 F (36.8 C)  TempSrc: Oral  SpO2: 100%  Weight: 165 lb 12.8 oz (75.2 kg)  Height: '5\' 7"'$  (1.702 m)    Body mass index is 25.97 kg/m.  Physical Exam Vitals and nursing note reviewed.  Constitutional:      General: She is not in acute distress.    Appearance: Normal appearance. She is well-developed. She is not ill-appearing, toxic-appearing or diaphoretic.  HENT:     Head: Normocephalic and atraumatic.     Nose: Nose normal.  Eyes:     General:        Right eye: No discharge.        Left eye: No discharge.     Conjunctiva/sclera: Conjunctivae normal.  Neck:     Trachea: No tracheal deviation.  Cardiovascular:     Rate and Rhythm: Normal rate and regular rhythm.     Pulses: Normal pulses.     Heart sounds: Normal heart sounds. No murmur heard.    No friction rub. No gallop.  Pulmonary:     Effort: Pulmonary effort is normal. No respiratory distress.     Breath sounds: No stridor.  Musculoskeletal:        General: Normal range of motion.  Skin:    General: Skin is warm and dry.     Findings: No rash.  Neurological:     Mental Status: She is alert. Mental status is at baseline.     Motor: No abnormal muscle tone.     Coordination: Coordination normal.  Psychiatric:        Mood and Affect: Mood normal.        Behavior: Behavior normal.         Assessment & Plan:   Problem List Items Addressed This Visit       Cardiovascular and Mediastinum    Primary hypertension - Primary    Has been elevated several times this year and not at goal, been on losartan 25 mg for years Increase losartan dose to 50 mg and f/up for BP recheck (or pt can send mychart msg with her home BP readings) BP Readings from Last 3 Encounters:  09/27/22 (!) 148/82  09/14/22 (!) 140/98  08/24/22 (!) 145/82        Relevant Medications   losartan (COZAAR) 50 MG tablet  Other Relevant Orders   Comprehensive metabolic panel     Other   Status post hysterectomy   Anemia    post op, would like to monitor the recovery, possibly some due to hemedilution  Hemoglobin  Date Value Ref Range Status  08/10/2022 9.6 (L) 12.0 - 15.0 g/dL Final  08/09/2022 11.9 (L) 12.0 - 15.0 g/dL Final  07/18/2022 13.3 12.0 - 15.0 g/dL Final  03/15/2022 13.6 11.7 - 15.5 g/dL Final        Relevant Orders   CBC with Differential/Platelet   Other Visit Diagnoses     Right flank pain       thought to be post op issue, her GYN is monitoring, we could do renal US again to recheck the prior mild hydro   Iron deficiency       hx of, we rechecked at her last physical and iron panel was fine, but she had acute blood loss with surgery, recheck   Relevant Orders   CBC with Differential/Platelet   Iron, TIBC and Ferritin Panel   Vitamin D deficiency       last vit d normal, no need to monitor it further, con't supplement     Last vitamin D Lab Results  Component Value Date   VD25OH 32 03/15/2022    She has f/up for CPE already scheduled  BP recheck in 2-3 weeks, she can come in for nurse BP recheck  Alicia Grana, PA-C 09/27/22 10:34 AM

## 2022-09-28 ENCOUNTER — Encounter: Payer: Self-pay | Admitting: Family Medicine

## 2022-09-28 DIAGNOSIS — D649 Anemia, unspecified: Secondary | ICD-10-CM | POA: Insufficient documentation

## 2022-09-28 DIAGNOSIS — I10 Essential (primary) hypertension: Secondary | ICD-10-CM | POA: Insufficient documentation

## 2022-09-28 NOTE — Assessment & Plan Note (Signed)
Has been elevated several times this year and not at goal, been on losartan 25 mg for years Increase losartan dose to 50 mg and f/up for BP recheck (or pt can send mychart msg with her home BP readings) BP Readings from Last 3 Encounters:  09/27/22 (!) 148/82  09/14/22 (!) 140/98  08/24/22 (!) 145/82

## 2022-09-28 NOTE — Assessment & Plan Note (Signed)
post op, would like to monitor the recovery, possibly some due to hemedilution  Hemoglobin  Date Value Ref Range Status  08/10/2022 9.6 (L) 12.0 - 15.0 g/dL Final  08/09/2022 11.9 (L) 12.0 - 15.0 g/dL Final  07/18/2022 13.3 12.0 - 15.0 g/dL Final  03/15/2022 13.6 11.7 - 15.5 g/dL Final

## 2022-10-06 ENCOUNTER — Encounter: Payer: Self-pay | Admitting: Oncology

## 2022-10-08 ENCOUNTER — Encounter: Payer: Self-pay | Admitting: Oncology

## 2022-12-14 ENCOUNTER — Ambulatory Visit: Payer: Commercial Managed Care - PPO | Admitting: Obstetrics & Gynecology

## 2022-12-14 ENCOUNTER — Encounter: Payer: Self-pay | Admitting: Obstetrics & Gynecology

## 2022-12-14 VITALS — BP 149/95 | HR 75 | Wt 166.0 lb

## 2022-12-14 DIAGNOSIS — L905 Scar conditions and fibrosis of skin: Secondary | ICD-10-CM | POA: Diagnosis not present

## 2022-12-14 DIAGNOSIS — D219 Benign neoplasm of connective and other soft tissue, unspecified: Secondary | ICD-10-CM

## 2022-12-14 NOTE — Progress Notes (Signed)
Follow up abdominal hysterectomy. Kathrene Alu RN

## 2022-12-14 NOTE — Progress Notes (Signed)
History:  37 y.o.LMP here today for 3 months post op check.Pt is s/p HYSTERECTOMY ABDOMINAL (N/A) - TAP BLOCK OPEN BILATERAL SALPINGECTOMY on 08/09/2022.  Pt reports that she is doing well. She is eating and passing stools without difficulty.   No limitations of activity.   The following portions of the patient's history were reviewed and updated as appropriate: allergies, current medications, past family history, past medical history, past social history, past surgical history and problem list.  Review of Systems:  Pertinent items are noted in HPI.    Objective:  Physical Exam BP (!) 136/96   Pulse 84   Wt 166 lb (75.3 kg)   LMP 05/16/2022 (Approximate) Comment: menstrual changed when pt started taking Myfembree  BMI 26.00 kg/m   CONSTITUTIONAL: Well-developed, well-nourished female in no acute distress.  HENT:  Normocephalic, atraumatic EYES: Conjunctivae and EOM are normal. No scleral icterus.  NECK: Normal range of motion SKIN: Skin is warm and dry. No rash noted. Not diaphoretic.No pallor. [place]: Alert and oriented to person, place, and time. Normal coordination.  Abd: Soft, nontender and nondistended; her incision is well healed. She has a sl keloid which is not bothering her.  Pelvic: deferred  Assessment & Plan:  3 months post op check following TAH with bilateral salpingectomy   Doing well  All questions answered.  F/u in 1 year for annual Pt is UTD on age appropriate screening tests   Mansi Tokar L. Harraway-Smith, M.D., Cherlynn June

## 2022-12-23 ENCOUNTER — Encounter: Payer: Self-pay | Admitting: Obstetrics & Gynecology

## 2023-03-17 NOTE — Patient Instructions (Incomplete)

## 2023-03-21 ENCOUNTER — Encounter: Payer: Self-pay | Admitting: Oncology

## 2023-03-21 ENCOUNTER — Other Ambulatory Visit: Payer: Self-pay

## 2023-03-21 ENCOUNTER — Encounter: Payer: Self-pay | Admitting: Family Medicine

## 2023-03-21 ENCOUNTER — Ambulatory Visit (INDEPENDENT_AMBULATORY_CARE_PROVIDER_SITE_OTHER): Payer: Commercial Managed Care - PPO | Admitting: Family Medicine

## 2023-03-21 VITALS — BP 156/94 | HR 81 | Temp 97.6°F | Resp 16 | Ht 66.75 in | Wt 166.2 lb

## 2023-03-21 DIAGNOSIS — N1330 Unspecified hydronephrosis: Secondary | ICD-10-CM | POA: Diagnosis not present

## 2023-03-21 DIAGNOSIS — R002 Palpitations: Secondary | ICD-10-CM

## 2023-03-21 DIAGNOSIS — R109 Unspecified abdominal pain: Secondary | ICD-10-CM | POA: Diagnosis not present

## 2023-03-21 DIAGNOSIS — R9431 Abnormal electrocardiogram [ECG] [EKG]: Secondary | ICD-10-CM | POA: Diagnosis not present

## 2023-03-21 DIAGNOSIS — E559 Vitamin D deficiency, unspecified: Secondary | ICD-10-CM | POA: Diagnosis not present

## 2023-03-21 DIAGNOSIS — R61 Generalized hyperhidrosis: Secondary | ICD-10-CM | POA: Diagnosis not present

## 2023-03-21 DIAGNOSIS — Z0000 Encounter for general adult medical examination without abnormal findings: Secondary | ICD-10-CM

## 2023-03-21 DIAGNOSIS — I10 Essential (primary) hypertension: Secondary | ICD-10-CM

## 2023-03-21 MED ORDER — LISINOPRIL 10 MG PO TABS
10.0000 mg | ORAL_TABLET | Freq: Every day | ORAL | 3 refills | Status: DC
  Filled 2023-03-21: qty 90, 90d supply, fill #0

## 2023-03-21 NOTE — Progress Notes (Signed)
Patient: Alicia Larson, Female    DOB: 02-15-1986, 37 y.o.   MRN: [id number] Danelle Berry, PA-C Visit Date: 03/21/2023  Today's Provider: Danelle Berry, PA-C   Chief Complaint  Patient presents with   Annual Exam   Subjective:   Annual physical exam:  Alicia Larson is a 37 y.o. female who presents today for complete physical exam:  Exercise/Activity:   Diet/nutrition: Sleep:   no concerns  SDOH Screenings   Food Insecurity: No Food Insecurity (03/21/2023)  Housing: Low Risk  (03/21/2023)  Transportation Needs: No Transportation Needs (03/21/2023)  Utilities: Not At Risk (03/21/2023)  Alcohol Screen: Low Risk  (03/21/2023)  Depression (PHQ2-9): Low Risk  (03/21/2023)  Financial Resource Strain: Low Risk  (03/21/2023)  Physical Activity: Insufficiently Active (03/21/2023)  Social Connections: Moderately Integrated (03/21/2023)  Stress: No Stress Concern Present (03/21/2023)  Tobacco Use: Low Risk  (03/21/2023)   BP Readings from Last 3 Encounters:  03/21/23 (!) 148/94  12/14/22 (!) 149/95  09/27/22 (!) 148/82  Change meds for HTN, check renal duplex    USPSTF grade A and B recommendations - reviewed and addressed today  Depression:  Phq 9 completed today by patient, was reviewed by me with patient in the room PHQ score is neg, pt feels good    03/21/2023    9:34 AM 09/27/2022   10:13 AM 03/15/2022   10:23 AM 03/09/2021   10:26 AM  PHQ 2/9 Scores  PHQ - 2 Score 0 0 0 0  PHQ- 9 Score 0 0 0 0      03/21/2023    9:34 AM 09/27/2022   10:13 AM 03/15/2022   10:23 AM 03/09/2021   10:26 AM 06/26/2019    8:38 AM  Depression screen PHQ 2/9  Decreased Interest 0 0 0 0 0  Down, Depressed, Hopeless 0 0 0 0 0  PHQ - 2 Score 0 0 0 0 0  Altered sleeping 0 0 0 0 0  Tired, decreased energy 0 0 0 0 0  Change in appetite 0 0 0 0 0  Feeling bad or failure about yourself  0 0 0 0 0  Trouble concentrating 0 0 0 0 0  Moving slowly or fidgety/restless 0 0 0 0 0  Suicidal thoughts 0  0 0 0 0  PHQ-9 Score 0 0 0 0 0  Difficult doing work/chores Not difficult at all Not difficult at all  Not difficult at all     Alcohol screening: Flowsheet Row Office Visit from 03/21/2023 in [facility]  AUDIT-C Score 0       Immunizations and Health Maintenance: Health Maintenance  Topic Date Due   COVID-19 Vaccine (3 - 2023-24 season) 04/06/2023 (Originally 06/24/2022)   INFLUENZA VACCINE  05/25/2023   PAP SMEAR-Modifier  04/06/2025   DTaP/Tdap/Td (3 - Td or Tdap) 05/16/2028   Hepatitis C Screening  Completed   HIV Screening  Completed   HPV VACCINES  Aged Out    Night sweats and some palpitations   Hep C Screening:  done  STD testing and prevention (HIV/chl/gon/syphilis):  see above, no additional testing desired by pt today  Intimate partner violence:   safe  Sexual History/Pain during Intercourse: Significant Other, no sex concerns  Menstrual History/LMP/Abnormal Bleeding: s/p TAH- BS Patient's last menstrual period was 05/16/2022 (approximate).  Incontinence Symptoms:  none  Breast cancer:  not yet due per age Last Mammogram: *see HM list above BRCA gene screening: none known  Cervical cancer screening:  UTD/s/p  total hysterectomy  Osteoporosis:   Discussion on osteoporosis per age, including high calcium and vitamin D supplementation, weight bearing exercises  Skin cancer:  Hx of skin CA -  NO Discussed atypical lesions   Colorectal cancer:   Colonoscopy is not due   Discussed concerning signs and sx of CRC, pt denies change in bowels  No hx in family   Lung cancer:   Low Dose CT Chest recommended if Age 53-80 years, 20 pack-year currently smoking OR have quit w/in 15years. Patient does not qualify.    Social History   Tobacco Use   Smoking status: Never   Smokeless tobacco: Never  Vaping Use   Vaping Use: Never used  Substance Use Topics   Alcohol use: Not Currently    Comment: ocassionally (maybe 1 drink 3-4 x a  month)   Drug use: No     Flowsheet Row Office Visit from 03/21/2023 in [facility]  AUDIT-C Score 0       Family History  Problem Relation Age of Onset   Diabetes Mother    Hypertension Mother    Hypertension Maternal Grandmother    Hyperlipidemia Maternal Grandfather    Hypertension Maternal Grandfather    Heart disease Maternal Grandfather    Leukemia Maternal Uncle      Blood pressure/Hypertension: BP Readings from Last 3 Encounters:  03/21/23 (!) 148/94  12/14/22 (!) 149/95  09/27/22 (!) 148/82    Weight/Obesity: Wt Readings from Last 3 Encounters:  03/21/23 166 lb 3.2 oz (75.4 kg)  12/14/22 166 lb (75.3 kg)  09/27/22 165 lb 12.8 oz (75.2 kg)   BMI Readings from Last 3 Encounters:  03/21/23 26.23 kg/m  12/14/22 26.00 kg/m  09/27/22 25.97 kg/m     Lipids:  Lab Results  Component Value Date   CHOL 149 03/15/2022   CHOL 161 03/09/2021   CHOL 156 06/26/2019   Lab Results  Component Value Date   HDL 50 03/15/2022   HDL 59 03/09/2021   HDL 58 06/26/2019   Lab Results  Component Value Date   LDLCALC 86 03/15/2022   LDLCALC 92 03/09/2021   LDLCALC 85 06/26/2019   Lab Results  Component Value Date   TRIG 46 03/15/2022   TRIG 35 03/09/2021   TRIG 58 06/26/2019   Lab Results  Component Value Date   CHOLHDL 3.0 03/15/2022   CHOLHDL 2.7 03/09/2021   CHOLHDL 2.7 06/26/2019   No results found for: "LDLDIRECT" Based on the results of lipid panel his/her cardiovascular risk factor ( using Poole Cohort )  in the next 10 years is: The ASCVD Risk score (Arnett DK, et al., 2019) failed to calculate for the following reasons:   The 2019 ASCVD risk score is only valid for ages 72 to 35  Glucose:  Glucose, Bld  Date Value Ref Range Status  08/09/2022 90 70 - 99 mg/dL Final    Comment:    Glucose reference range applies only to samples taken after fasting for at least 8 hours.  07/18/2022 98 70 - 99 mg/dL Final    Comment:     Glucose reference range applies only to samples taken after fasting for at least 8 hours.  03/15/2022 80 65 - 99 mg/dL Final    Comment:    .            Fasting reference interval .     Advanced Care Planning:  A voluntary discussion about advance care planning including  the explanation and discussion of advance directives.    Social History       Social History   Socioeconomic History   Marital status: Significant Other    Spouse name: Not on file   Number of children: 0   Years of education: 16   Highest education level: Bachelor's degree (e.g., BA, AB, BS)  Occupational History    Comment: Nurse  Tobacco Use   Smoking status: Never   Smokeless tobacco: Never  Vaping Use   Vaping Use: Never used  Substance and Sexual Activity   Alcohol use: Not Currently    Comment: ocassionally (maybe 1 drink 3-4 x a month)   Drug use: No   Sexual activity: Yes    Partners: Male    Birth control/protection: None  Other Topics Concern   Not on file  Social History Narrative   Not on file   Social Determinants of Health   Financial Resource Strain: Low Risk  (03/21/2023)   Overall Financial Resource Strain (CARDIA)    Difficulty of Paying Living Expenses: Not hard at all  Food Insecurity: No Food Insecurity (03/21/2023)   Hunger Vital Sign    Worried About Running Out of Food in the Last Year: Never true    Ran Out of Food in the Last Year: Never true  Transportation Needs: No Transportation Needs (03/21/2023)   PRAPARE - Administrator, Civil Service (Medical): No    Lack of Transportation (Non-Medical): No  Physical Activity: Insufficiently Active (03/21/2023)   Exercise Vital Sign    Days of Exercise per Week: 2 days    Minutes of Exercise per Session: 20 min  Stress: No Stress Concern Present (03/21/2023)   [employer] of Occupational Health - Occupational Stress Questionnaire    Feeling of Stress : Only a little  Social Connections: Moderately Integrated  (03/21/2023)   Social Connection and Isolation Panel [NHANES]    Frequency of Communication with Friends and Family: More than three times a week    Frequency of Social Gatherings with Friends and Family: More than three times a week    Attends Religious Services: 1 to 4 times per year    Active Member of [employer] or Organizations: Yes    Attends Banker Meetings: Never    Marital Status: Never married    Family History        Family History  Problem Relation Age of Onset   Diabetes Mother    Hypertension Mother    Hypertension Maternal Grandmother    Hyperlipidemia Maternal Grandfather    Hypertension Maternal Grandfather    Heart disease Maternal Grandfather    Leukemia Maternal Uncle     Patient Active Problem List   Diagnosis Date Noted   Primary hypertension 09/28/2022   Anemia 09/28/2022   Status post hysterectomy 08/09/2022   Iron deficiency anemia 01/25/2018    Past Surgical History:  Procedure Laterality Date   ABDOMINAL HYSTERECTOMY N/A 08/09/2022   Procedure: HYSTERECTOMY ABDOMINAL;  Surgeon: Willodean Rosenthal, MD;  Location: [facility] OR;  Service: Gynecology;  Laterality: N/A;  TAP BLOCK   BILATERAL SALPINGECTOMY  08/09/2022   Procedure: OPEN BILATERAL SALPINGECTOMY;  Surgeon: Willodean Rosenthal, MD;  Location: [facility] OR;  Service: Gynecology;;   WISDOM TOOTH EXTRACTION  10/24/2006     Current Outpatient Medications:    losartan (COZAAR) 50 MG tablet, Take 1 tablet (50 mg total) by mouth daily. (Patient not taking: Reported on 03/21/2023), Disp: 90 tablet, Rfl: 3  No Known Allergies  Patient Care Team: Danelle Berry, PA-C as PCP - General (Family Medicine)   Chart Review: I personally reviewed active problem list, medication list, allergies, family history, social history, health maintenance, notes from last encounter, lab results, imaging with the patient/caregiver today.   Review of Systems  Constitutional: Negative.   HENT: Negative.     Eyes: Negative.   Respiratory: Negative.    Cardiovascular: Negative.   Gastrointestinal: Negative.   Endocrine: Negative.   Genitourinary: Negative.   Musculoskeletal: Negative.   Skin: Negative.   Allergic/Immunologic: Negative.   Neurological: Negative.   Hematological: Negative.   Psychiatric/Behavioral: Negative.    All other systems reviewed and are negative.         Objective:   Vitals:  Vitals:   03/21/23 0939  BP: (!) 148/94  Pulse: 81  Resp: 16  Temp: 97.6 F (36.4 C)  TempSrc: Oral  SpO2: 100%  Weight: 166 lb 3.2 oz (75.4 kg)  Height: 5' 6.75" (1.695 m)    Body mass index is 26.23 kg/m.  Physical Exam Vitals and nursing note reviewed.  Constitutional:      General: She is not in acute distress.    Appearance: Normal appearance. She is well-developed. She is not ill-appearing, toxic-appearing or diaphoretic.  HENT:     Head: Normocephalic and atraumatic.     Right Ear: External ear normal.     Left Ear: External ear normal.  Eyes:     General: No scleral icterus.       Right eye: No discharge.        Left eye: No discharge.     Conjunctiva/sclera: Conjunctivae normal.  Neck:     Trachea: No tracheal deviation.  Cardiovascular:     Rate and Rhythm: Normal rate and regular rhythm.     Pulses: Normal pulses.     Heart sounds: Normal heart sounds.  Pulmonary:     Effort: Pulmonary effort is normal. No respiratory distress.     Breath sounds: Normal breath sounds. No stridor.  Abdominal:     General: Bowel sounds are normal.     Palpations: Abdomen is soft.  Musculoskeletal:     Right lower leg: No edema.     Left lower leg: No edema.  Skin:    Capillary Refill: Capillary refill takes less than 2 seconds.     Coloration: Skin is not jaundiced or pale.     Findings: No rash.  Neurological:     Mental Status: She is alert.     Gait: Gait normal.  Psychiatric:        Mood and Affect: Mood normal.        Behavior: Behavior normal.        Fall Risk:    03/21/2023    9:34 AM 09/27/2022   10:12 AM 03/15/2022   10:20 AM 03/09/2021   10:26 AM 06/26/2019    8:38 AM  Fall Risk   Falls in the past year? 0 0 0 0 0  Number falls in past yr: 0 0  0 0  Injury with Fall? 0 0  0 0  Risk for fall due to : No Fall Risks No Fall Risks No Fall Risks    Follow up Falls prevention discussed;Education provided;Falls evaluation completed Falls prevention discussed;Education provided;Falls evaluation completed Falls prevention discussed;Falls evaluation completed      Functional Status Survey: Is the patient deaf or have difficulty hearing?: No Does the patient have difficulty seeing, even  when wearing glasses/contacts?: No Does the patient have difficulty concentrating, remembering, or making decisions?: No Does the patient have difficulty walking or climbing stairs?: No Does the patient have difficulty dressing or bathing?: No Does the patient have difficulty doing errands alone such as visiting a doctor's office or shopping?: No   Assessment & Plan:    CPE completed today  USPSTF grade A and B recommendations reviewed with patient; age-appropriate recommendations, preventive care, screening tests, etc discussed and encouraged; healthy living encouraged; see AVS for patient education given to patient  Discussed importance of 150 minutes of physical activity weekly, AHA exercise recommendations given to pt in AVS/handout  Discussed importance of healthy diet:  eating lean meats and proteins, avoiding trans fats and saturated fats, avoid simple sugars and excessive carbs in diet, eat 6 servings of fruit/vegetables daily and drink plenty of water and avoid sweet beverages.    Recommended pt to do annual eye exam and routine dental exams/cleanings  Depression, alcohol, fall screening completed as documented above and per flowsheets  Advance Care planning information and packet discussed and offered today, encouraged pt to discuss  with family members/spouse/partner/friends and complete Advanced directive packet and bring copy to office   Reviewed Health Maintenance: Health Maintenance  Topic Date Due   COVID-19 Vaccine (3 - 2023-24 season) 04/06/2023 (Originally 06/24/2022)   INFLUENZA VACCINE  05/25/2023   PAP SMEAR-Modifier  04/06/2025   DTaP/Tdap/Td (3 - Td or Tdap) 05/16/2028   Hepatitis C Screening  Completed   HIV Screening  Completed   HPV VACCINES  Aged Out    Immunizations: Immunization History  Administered Date(s) Administered   Influenza-Unspecified 07/25/2015, 07/18/2019, 07/22/2021, 08/08/2022   PFIZER(Purple Top)SARS-COV-2 Vaccination 06/05/2020, 06/26/2020   PPD Test 09/28/2021   Tdap 10/24/2005, 05/16/2018   Vaccines:  HPV: up to at age 94 , ask insurance if age between 36-45  Shingrix: 56-64 yo and ask insurance if covered when patient above 73 yo Pneumonia:  educated and discussed with patient. Flu:  educated and discussed with patient. COVID:      ICD-10-CM   1. Annual physical exam  Z00.00 COMPLETE METABOLIC PANEL WITH GFR    CBC with Differential/Platelet    Lipid panel    TSH    TSH    2. Vitamin D deficiency  E55.9     3. Hypertension, unspecified type  I10 COMPLETE METABOLIC PANEL WITH GFR    lisinopril (ZESTRIL) 10 MG tablet    VAS US RENAL ARTERY DUPLEX   uncontrolled, not better on losartan 25 or 50, change in meds and f/up in the next month for recheck    4. Abnormal ECG  R94.31 EKG 12-Lead   1st degree AV block    5. Palpitations  R00.2 EKG 12-Lead    TSH    TSH   couple times a week, flutter, lasts a few seconds    6. Hydronephrosis, right  N13.30 US Renal    7. Flank pain  R10.9 US Renal    8. Night sweats  R61 TSH    TSH   may be perimenopausal sx      PT agrees to try alternate HTN med - d/c ARB, try ACEI - lisinopril 10 mg - anticipate she will need to increase to 20 mg, f/up in office in the next month and if still uncontrolled would try  lisinopril-HCTZ    Danelle Berry, PA-C 03/21/23 9:56 AM  [facility]

## 2023-03-22 LAB — CBC WITH DIFFERENTIAL/PLATELET
Absolute Monocytes: 285 cells/uL (ref 200–950)
Basophils Absolute: 11 cells/uL (ref 0–200)
Basophils Relative: 0.3 %
Eosinophils Absolute: 30 cells/uL (ref 15–500)
Eosinophils Relative: 0.8 %
HCT: 39.7 % (ref 35.0–45.0)
Hemoglobin: 11.9 g/dL (ref 11.7–15.5)
Lymphs Abs: 1336 cells/uL (ref 850–3900)
MCH: 22.7 pg — ABNORMAL LOW (ref 27.0–33.0)
MCHC: 30 g/dL — ABNORMAL LOW (ref 32.0–36.0)
MCV: 75.6 fL — ABNORMAL LOW (ref 80.0–100.0)
MPV: 11.5 fL (ref 7.5–12.5)
Monocytes Relative: 7.7 %
Neutro Abs: 2039 cells/uL (ref 1500–7800)
Neutrophils Relative %: 55.1 %
Platelets: 298 10*3/uL (ref 140–400)
RBC: 5.25 10*6/uL — ABNORMAL HIGH (ref 3.80–5.10)
RDW: 13.5 % (ref 11.0–15.0)
Total Lymphocyte: 36.1 %
WBC: 3.7 10*3/uL — ABNORMAL LOW (ref 3.8–10.8)

## 2023-03-22 LAB — LIPID PANEL
Cholesterol: 158 mg/dL (ref <200)
HDL: 57 mg/dL (ref >=50)
LDL Cholesterol (Calc): 89 mg/dL (calc)
Non-HDL Cholesterol (Calc): 101 mg/dL (calc) (ref <130)
Total CHOL/HDL Ratio: 2.8 (calc) (ref <5.0)
Triglycerides: 41 mg/dL (ref <150)

## 2023-03-22 LAB — COMPLETE METABOLIC PANEL WITH GFR
AG Ratio: 1.4 (calc) (ref 1.0–2.5)
ALT: 13 U/L (ref 6–29)
AST: 18 U/L (ref 10–30)
Albumin: 4.5 g/dL (ref 3.6–5.1)
Alkaline phosphatase (APISO): 95 U/L (ref 31–125)
BUN: 9 mg/dL (ref 7–25)
CO2: 27 mmol/L (ref 20–32)
Calcium: 9.8 mg/dL (ref 8.6–10.2)
Chloride: 106 mmol/L (ref 98–110)
Creat: 0.8 mg/dL (ref 0.50–0.97)
Globulin: 3.3 g/dL (calc) (ref 1.9–3.7)
Glucose, Bld: 78 mg/dL (ref 65–99)
Potassium: 4.4 mmol/L (ref 3.5–5.3)
Sodium: 141 mmol/L (ref 135–146)
Total Bilirubin: 0.5 mg/dL (ref 0.2–1.2)
Total Protein: 7.8 g/dL (ref 6.1–8.1)
eGFR: 97 mL/min/{1.73_m2} (ref >=60)

## 2023-03-22 LAB — TSH: TSH: 0.78 mIU/L

## 2023-03-24 ENCOUNTER — Encounter: Payer: Self-pay | Admitting: Family Medicine

## 2023-03-24 DIAGNOSIS — R718 Other abnormality of red blood cells: Secondary | ICD-10-CM

## 2023-03-24 DIAGNOSIS — D649 Anemia, unspecified: Secondary | ICD-10-CM

## 2023-03-27 ENCOUNTER — Encounter: Payer: Self-pay | Admitting: Oncology

## 2023-03-27 ENCOUNTER — Ambulatory Visit
Admission: RE | Admit: 2023-03-27 | Discharge: 2023-03-27 | Disposition: A | Discharge status: Home / Self Care | Payer: Commercial Managed Care - PPO | Source: Ambulatory Visit | Attending: Family Medicine | Admitting: Family Medicine

## 2023-03-27 DIAGNOSIS — N1330 Unspecified hydronephrosis: Secondary | ICD-10-CM | POA: Insufficient documentation

## 2023-03-27 DIAGNOSIS — R109 Unspecified abdominal pain: Secondary | ICD-10-CM | POA: Diagnosis not present

## 2023-03-29 ENCOUNTER — Encounter: Payer: Self-pay | Admitting: Family Medicine

## 2023-03-29 DIAGNOSIS — R109 Unspecified abdominal pain: Secondary | ICD-10-CM

## 2023-03-29 DIAGNOSIS — N1330 Unspecified hydronephrosis: Secondary | ICD-10-CM

## 2023-04-19 ENCOUNTER — Encounter: Payer: Self-pay | Admitting: Urology

## 2023-04-19 ENCOUNTER — Other Ambulatory Visit
Admission: RE | Admit: 2023-04-19 | Discharge: 2023-04-19 | Disposition: A | Discharge status: Home / Self Care | Payer: Commercial Managed Care - PPO | Attending: Urology | Admitting: Urology

## 2023-04-19 ENCOUNTER — Other Ambulatory Visit: Payer: Self-pay | Admitting: *Deleted

## 2023-04-19 ENCOUNTER — Ambulatory Visit (INDEPENDENT_AMBULATORY_CARE_PROVIDER_SITE_OTHER): Payer: Commercial Managed Care - PPO | Admitting: Urology

## 2023-04-19 ENCOUNTER — Other Ambulatory Visit: Payer: Self-pay

## 2023-04-19 VITALS — BP 159/96 | HR 73 | Ht 66.0 in | Wt 170.0 lb

## 2023-04-19 DIAGNOSIS — R109 Unspecified abdominal pain: Secondary | ICD-10-CM

## 2023-04-19 DIAGNOSIS — N1330 Unspecified hydronephrosis: Secondary | ICD-10-CM

## 2023-04-19 DIAGNOSIS — R300 Dysuria: Secondary | ICD-10-CM | POA: Diagnosis not present

## 2023-04-19 LAB — URINALYSIS, COMPLETE (UACMP) WITH MICROSCOPIC
Bacteria, UA: NONE SEEN
Bilirubin Urine: NEGATIVE
Glucose, UA: NEGATIVE mg/dL
Hgb urine dipstick: NEGATIVE
Ketones, ur: NEGATIVE mg/dL
Leukocytes,Ua: NEGATIVE
Nitrite: NEGATIVE
Protein, ur: NEGATIVE mg/dL
Specific Gravity, Urine: 1.01 (ref 1.005–1.030)
pH: 6 (ref 5.0–8.0)

## 2023-04-19 MED ORDER — CLINDAMYCIN HCL 150 MG PO CAPS
150.0000 mg | ORAL_CAPSULE | Freq: Four times a day (QID) | ORAL | 1 refills | Status: DC
  Filled 2023-04-19: qty 28, 7d supply, fill #0

## 2023-04-19 NOTE — Progress Notes (Signed)
04/19/23 11:31 AM   Marcille Buffy 04/23/1986 [id number]  CC: Right pelviectasis, right-sided flank pain  HPI: 37 year old female who originally presented to PCP in June 2023 with abdominal and right-sided flank pain and renal ultrasound showed mild right-sided hydronephrosis, follow-up CT abdomen and pelvis with contrast showed markedly enlarged, lobulated, and fibroid uterus with mild right and minimal left-sided hydronephrosis felt to be secondary to compression from the uterus.  She ultimately underwent an open abdominal hysterectomy in [place] in October 2023.  I reviewed the operative note that makes no mention of any concern for ureteral injury.  She has continued to have intermittent right-sided flank discomfort with movement over the last few months, and PCP ordered a renal ultrasound on 03/27/2023 showing mild right pelviectasis.  Urinalysis today is benign.  She works as a Engineer, civil (consulting) with [employer].  PMH: Past Medical History:  Diagnosis Date   Anemia    Dermatitis 05/16/2013   02/28/2013. [facility]. Tollie Eth MPAS PAC.  Dermatitis-Nummular (arms,legs,buttocks) Treatment Plan: Triamincinolone 0.1% Cream apply twice daily. Tinea versicolor (back x) Treatment Plan: Selenium Sulfide Wash twice weekly. Ketoconazole Cream aaa bid x 3 wks.    Elevated blood protein 01/25/2018   Fibroids 09/16/2015   Fibroids, intramural 08/09/2022   Hydronephrosis of right kidney    due to fibroids   Hypertension    Menorrhagia 09/03/2015    Surgical History: Past Surgical History:  Procedure Laterality Date   ABDOMINAL HYSTERECTOMY N/A 08/09/2022   Procedure: HYSTERECTOMY ABDOMINAL;  Surgeon: Willodean Rosenthal, MD;  Location: [facility] OR;  Service: Gynecology;  Laterality: N/A;  TAP BLOCK   BILATERAL SALPINGECTOMY  08/09/2022   Procedure: OPEN BILATERAL SALPINGECTOMY;  Surgeon: Willodean Rosenthal, MD;  Location: [facility] OR;  Service: Gynecology;;   WISDOM  TOOTH EXTRACTION  10/24/2006     Family History: Family History  Problem Relation Age of Onset   Diabetes Mother    Hypertension Mother    Hypertension Maternal Grandmother    Hyperlipidemia Maternal Grandfather    Hypertension Maternal Grandfather    Heart disease Maternal Grandfather    Leukemia Maternal Uncle     Social History:  reports that she has never smoked. She has never been exposed to tobacco smoke. She has never used smokeless tobacco. She reports that she does not currently use alcohol. She reports that she does not use drugs.  Physical Exam: BP (!) 159/96   Pulse 73   Ht 5\' 6"  (1.676 m)   Wt 170 lb (77.1 kg)   LMP 05/16/2022 (Approximate) Comment: menstrual changed when pt started taking Myfembree  BMI 27.44 kg/m    Constitutional:  Alert and oriented, No acute distress. Cardiovascular: No clubbing, cyanosis, or edema. Respiratory: Normal respiratory effort, no increased work of breathing. GI: Abdomen is soft, nontender, nondistended, no abdominal masses   Laboratory Data: Reviewed, renal function normal with creatinine 0.8, EGFR greater than 60  Pertinent Imaging: I have personally viewed and interpreted the prior CT scan showing significantly enlarged fibroid uterus causing suspected compression on the ureters with bilateral mild hydronephrosis, as well as the most recent renal ultrasound showing mild right pelviectasis.  Assessment & Plan:   37 year old female who originally presented in June 2023 with abdominal and right-sided flank pain with renal ultrasound showing mild bilateral hydronephrosis, with follow-up CT suggesting this was secondary to external compression from a significantly enlarged fibroid uterus.  She underwent an open hysterectomy in [facility] in October 2024 that was reportedly uncomplicated, but has  had some persistent right-sided flank discomfort since that time, and a recent renal ultrasound from June 2024 suggested mild right  pelviectasis.  Urinalysis today is benign.  We reviewed possible etiologies of the mild right pelviectasis including physiologic, iatrogenic from potential right ureteral injury or scarring during prior hysterectomy, or nephrolithiasis.  We also discussed that her right-sided pain could be musculoskeletal and unrelated to these ultrasound findings.  I recommended a CT urogram for further evaluation.  We discussed possible need for additional treatments pending the results of this test including cystoscopy and retrograde pyelogram, potential diagnostic ureteroscopy, or even balloon dilation of the ureter, or need for more aggressive reconstructive procedure  CT urogram, call with results  Legrand Rams, MD 04/19/2023  [facility] [address] [place], [place] [address] [phone]

## 2023-04-21 ENCOUNTER — Ambulatory Visit: Payer: Commercial Managed Care - PPO | Attending: Nurse Practitioner

## 2023-04-21 ENCOUNTER — Ambulatory Visit (INDEPENDENT_AMBULATORY_CARE_PROVIDER_SITE_OTHER): Payer: Commercial Managed Care - PPO | Admitting: Nurse Practitioner

## 2023-04-21 ENCOUNTER — Ambulatory Visit: Payer: Commercial Managed Care - PPO | Admitting: Family Medicine

## 2023-04-21 VITALS — BP 132/86 | HR 76 | Temp 97.9°F | Resp 16 | Ht 66.75 in | Wt 170.5 lb

## 2023-04-21 DIAGNOSIS — I10 Essential (primary) hypertension: Secondary | ICD-10-CM

## 2023-04-21 DIAGNOSIS — R002 Palpitations: Secondary | ICD-10-CM

## 2023-04-21 DIAGNOSIS — D649 Anemia, unspecified: Secondary | ICD-10-CM | POA: Diagnosis not present

## 2023-04-21 DIAGNOSIS — R718 Other abnormality of red blood cells: Secondary | ICD-10-CM | POA: Diagnosis not present

## 2023-04-21 MED ORDER — LISINOPRIL 10 MG PO TABS: 20.0000 mg | ORAL_TABLET | Freq: Every day | ORAL | 0 refills | Status: DC

## 2023-04-21 NOTE — Progress Notes (Signed)
BP 132/86   Pulse 76   Temp 97.9 F (36.6 C) (Oral)   Resp 16   Ht 5' 6.75" (1.695 m)   Wt 170 lb 8 oz (77.3 kg)   LMP 05/16/2022 (Approximate) Comment: menstrual changed when pt started taking Myfembree  SpO2 100%   BMI 26.90 kg/m    Subjective:    Patient ID: Alicia Larson, female    DOB: 01/31/1986, 37 y.o.   MRN: [id number]  HPI: Alicia Larson is a 37 y.o. female  Chief Complaint  Patient presents with   Follow-up   Hypertension    Still elevated at home when pt has checked it.   Hypertension:  -Medications: lisinopril 10 mg daily -Patient is compliant with above medications and reports no side effects. -Checking BP at home (average): 130-150s/90s -Denies any SOB, CP, vision changes, LE edema or symptoms of hypotension -Diet: recommend DASH diet -Exercise: increase physical activity as tolerated  Will increase lisinopril to 20 mg daily.  According to plan of care in previous visit with Danelle Berry PA: PT agrees to try alternate HTN med - d/c ARB, try ACEI - lisinopril 10 mg - anticipate she will need to increase to 20 mg, f/up in office in the next month and if still uncontrolled would try lisinopril-HCTZ .      04/21/2023   12:52 PM 04/19/2023   10:02 AM 03/21/2023   10:32 AM  Vitals with BMI  Height 5' 6.75" 5\' 6"    Weight 170 lbs 8 oz 170 lbs   BMI 26.92 27.45   Systolic 132 159 409  Diastolic 86 96 94  Pulse 76 73      Palpitations: patient reports she had discussed palpitations at her last visit, EKG was done and labs were completed. Patient reports that she still has palpitations. Will get zio patch.   Relevant past medical, surgical, family and social history reviewed and updated as indicated. Interim medical history since our last visit reviewed. Allergies and medications reviewed and updated.  Review of Systems  Constitutional: Negative for fever or weight change.  Respiratory: Negative for cough and shortness of breath.   Cardiovascular:  Negative for chest pain , positive for  palpitations.  Gastrointestinal: Negative for abdominal pain, no bowel changes.  Musculoskeletal: Negative for gait problem or joint swelling.  Skin: Negative for rash.  Neurological: Negative for dizziness or headache.  No other specific complaints in a complete review of systems (except as listed in HPI above).      Objective:    BP 132/86   Pulse 76   Temp 97.9 F (36.6 C) (Oral)   Resp 16   Ht 5' 6.75" (1.695 m)   Wt 170 lb 8 oz (77.3 kg)   LMP 05/16/2022 (Approximate) Comment: menstrual changed when pt started taking Myfembree  SpO2 100%   BMI 26.90 kg/m   Wt Readings from Last 3 Encounters:  04/21/23 170 lb 8 oz (77.3 kg)  04/19/23 170 lb (77.1 kg)  03/21/23 166 lb 3.2 oz (75.4 kg)    Physical Exam  Constitutional: Patient appears well-developed and well-nourished.  No distress.  HEENT: head atraumatic, normocephalic, pupils equal and reactive to light, neck supple Cardiovascular: Normal rate, regular rhythm and normal heart sounds.  No murmur heard. No BLE edema. Pulmonary/Chest: Effort normal and breath sounds normal. No respiratory distress. Abdominal: Soft.  There is no tenderness. Psychiatric: Patient has a normal mood and affect. behavior is normal. Judgment and thought content normal.   Results  for orders placed or performed during the hospital encounter of 04/19/23  Urinalysis, Complete w Microscopic -  Result Value Ref Range   Color, Urine YELLOW YELLOW   APPearance CLEAR CLEAR   Specific Gravity, Urine 1.010 1.005 - 1.030   pH 6.0 5.0 - 8.0   Glucose, UA NEGATIVE NEGATIVE mg/dL   Hgb urine dipstick NEGATIVE NEGATIVE   Bilirubin Urine NEGATIVE NEGATIVE   Ketones, ur NEGATIVE NEGATIVE mg/dL   Protein, ur NEGATIVE NEGATIVE mg/dL   Nitrite NEGATIVE NEGATIVE   Leukocytes,Ua NEGATIVE NEGATIVE   Squamous Epithelial / HPF 0-5 0 - 5 /HPF   WBC, UA 0-5 0 - 5 WBC/hpf   RBC / HPF 0-5 0 - 5 RBC/hpf   Bacteria, UA NONE SEEN  NONE SEEN      Assessment & Plan:   Problem List Items Addressed This Visit       Cardiovascular and Mediastinum   Primary hypertension - Primary    Increase lisinopril to 20 mg daily.  Patient currently has lisinopril 10 mg she is going to take 2 tablets. She is going to continue to monitor blood pressure and send mychart message with results. Follow up in three months.       Other Visit Diagnoses     Palpitations       zio patch   Relevant Orders   LONG TERM MONITOR (3-14 DAYS)        Follow up plan: Return in about 3 months (around 07/22/2023) for follow up.

## 2023-04-21 NOTE — Assessment & Plan Note (Signed)
Increase lisinopril to 20 mg daily.  Patient currently has lisinopril 10 mg she is going to take 2 tablets. She is going to continue to monitor blood pressure and send mychart message with results. Follow up in three months.

## 2023-04-24 ENCOUNTER — Other Ambulatory Visit: Payer: Self-pay

## 2023-04-24 LAB — IRON,TIBC AND FERRITIN PANEL
%SAT: 15 % (calc) — ABNORMAL LOW (ref 16–45)
Ferritin: 7 ng/mL — ABNORMAL LOW (ref 16–154)
Iron: 64 ug/dL (ref 40–190)
TIBC: 414 mcg/dL (calc) (ref 250–450)

## 2023-04-24 LAB — PATHOLOGIST SMEAR REVIEW

## 2023-04-24 MED ORDER — HYDROCODONE-ACETAMINOPHEN 5-325 MG PO TABS
1.0000 | ORAL_TABLET | ORAL | 0 refills | Status: DC | PRN
  Filled 2023-04-24: qty 20, 4d supply, fill #0

## 2023-05-03 DIAGNOSIS — R002 Palpitations: Secondary | ICD-10-CM | POA: Diagnosis not present

## 2023-05-08 ENCOUNTER — Encounter: Payer: Self-pay | Admitting: Nurse Practitioner

## 2023-05-15 ENCOUNTER — Ambulatory Visit
Admission: RE | Admit: 2023-05-15 | Discharge: 2023-05-15 | Disposition: A | Discharge status: Home / Self Care | Payer: Commercial Managed Care - PPO | Source: Ambulatory Visit | Attending: Urology | Admitting: Urology

## 2023-05-15 DIAGNOSIS — N1330 Unspecified hydronephrosis: Secondary | ICD-10-CM | POA: Insufficient documentation

## 2023-05-15 DIAGNOSIS — R109 Unspecified abdominal pain: Secondary | ICD-10-CM | POA: Diagnosis not present

## 2023-05-15 MED ORDER — IOHEXOL 300 MG/ML  SOLN
100.0000 mL | Freq: Once | INTRAMUSCULAR | Status: AC | PRN
  Administered 2023-05-15: 150 mL via INTRAVENOUS

## 2023-05-18 ENCOUNTER — Other Ambulatory Visit: Payer: Self-pay | Admitting: Nurse Practitioner

## 2023-05-18 DIAGNOSIS — I10 Essential (primary) hypertension: Secondary | ICD-10-CM

## 2023-05-18 MED ORDER — LISINOPRIL 20 MG PO TABS: 20.0000 mg | ORAL_TABLET | Freq: Every day | ORAL | 1 refills | Status: DC

## 2023-05-22 ENCOUNTER — Other Ambulatory Visit: Payer: Self-pay | Admitting: Nurse Practitioner

## 2023-05-22 ENCOUNTER — Other Ambulatory Visit (HOSPITAL_COMMUNITY): Payer: Self-pay

## 2023-05-22 ENCOUNTER — Other Ambulatory Visit: Payer: Self-pay | Admitting: Family Medicine

## 2023-05-22 ENCOUNTER — Other Ambulatory Visit: Payer: Self-pay

## 2023-05-22 DIAGNOSIS — R002 Palpitations: Secondary | ICD-10-CM | POA: Diagnosis not present

## 2023-05-22 DIAGNOSIS — I10 Essential (primary) hypertension: Secondary | ICD-10-CM

## 2023-05-22 MED ORDER — LISINOPRIL 20 MG PO TABS
20.0000 mg | ORAL_TABLET | Freq: Every day | ORAL | 1 refills | Status: DC
  Filled 2023-05-22: qty 90, 90d supply, fill #0
  Filled 2023-08-21: qty 90, 90d supply, fill #1

## 2023-05-24 ENCOUNTER — Encounter: Payer: Self-pay | Admitting: Nurse Practitioner

## 2023-07-21 ENCOUNTER — Ambulatory Visit: Payer: Commercial Managed Care - PPO | Admitting: Family Medicine

## 2023-08-11 ENCOUNTER — Encounter: Payer: Self-pay | Admitting: Oncology

## 2023-08-11 ENCOUNTER — Other Ambulatory Visit: Payer: Self-pay

## 2023-08-11 MED ORDER — FLULAVAL 0.5 ML IM SUSY
0.5000 mL | PREFILLED_SYRINGE | Freq: Once | INTRAMUSCULAR | 0 refills | Status: AC
  Filled 2023-08-11: qty 0.5, 1d supply, fill #0

## 2023-11-20 ENCOUNTER — Other Ambulatory Visit: Payer: Self-pay

## 2023-11-20 MED ORDER — AMOXICILLIN 500 MG PO CAPS
500.0000 mg | ORAL_CAPSULE | Freq: Four times a day (QID) | ORAL | 0 refills | Status: DC
  Filled 2023-11-20: qty 28, 7d supply, fill #0

## 2023-11-29 ENCOUNTER — Other Ambulatory Visit: Payer: Self-pay

## 2023-11-29 ENCOUNTER — Encounter: Payer: Self-pay | Admitting: Oncology

## 2023-11-29 MED ORDER — ACETAMINOPHEN-CODEINE 300-30 MG PO TABS
1.0000 | ORAL_TABLET | Freq: Four times a day (QID) | ORAL | 0 refills | Status: DC | PRN
  Filled 2023-11-29: qty 12, 3d supply, fill #0

## 2023-12-09 ENCOUNTER — Telehealth: Payer: Commercial Managed Care - PPO | Admitting: Physician Assistant

## 2023-12-09 DIAGNOSIS — N760 Acute vaginitis: Secondary | ICD-10-CM | POA: Diagnosis not present

## 2023-12-09 MED ORDER — FLUCONAZOLE 150 MG PO TABS: 150.0000 mg | ORAL_TABLET | Freq: Once | ORAL | 0 refills | Status: AC

## 2023-12-09 NOTE — Progress Notes (Signed)

## 2023-12-09 NOTE — Progress Notes (Signed)
 I have spent 5 minutes in review of e-visit questionnaire, review and updating patient chart, medical decision making and response to patient.   Laure Kidney, PA-C

## 2023-12-11 ENCOUNTER — Other Ambulatory Visit: Payer: Self-pay

## 2024-03-08 IMAGING — US US RENAL
1 series · 14 of 25 positions shown · non-contrast
Comparison: None available

CLINICAL DATA: Right flank pain

History of large fibroid uterus
Evaluate for hydronephrosis
EXAM:
RENAL / URINARY TRACT ULTRASOUND COMPLETE

[Series 1: us renal · 14 of 52 slices shown]
[im 1/52]
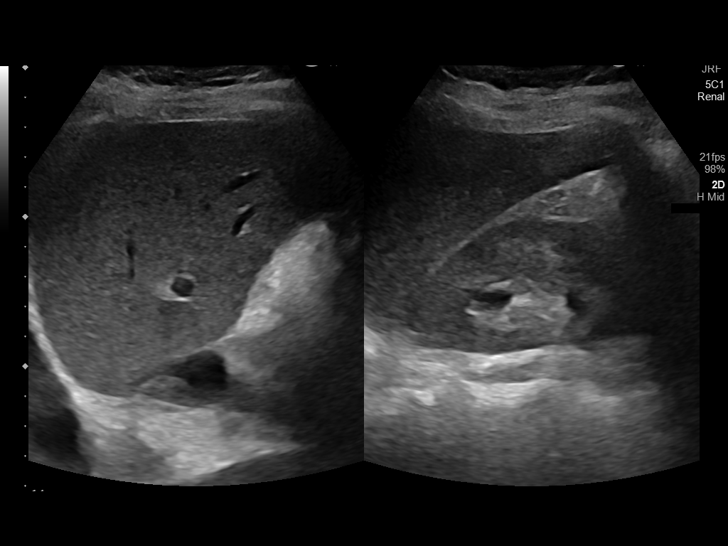
[im 5/52]
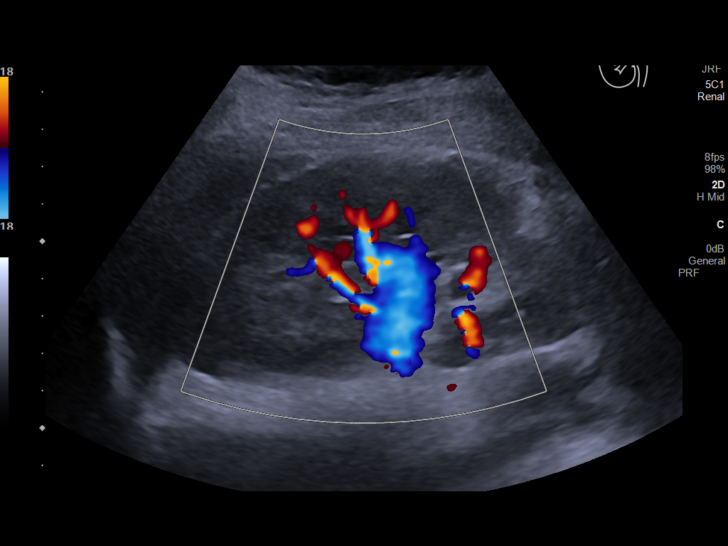
[im 9/52]
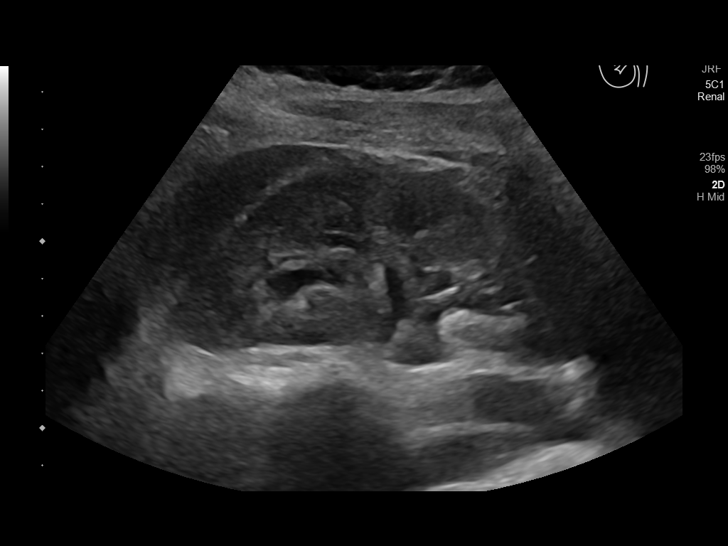
[im 13/52]
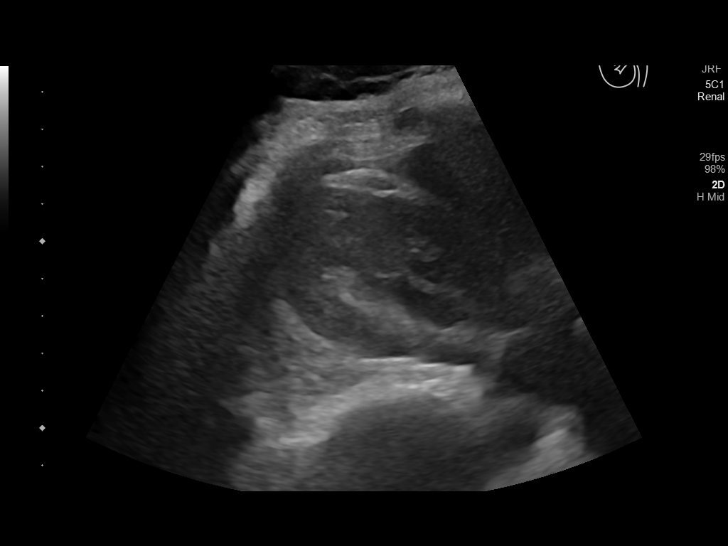
[im 18/52]
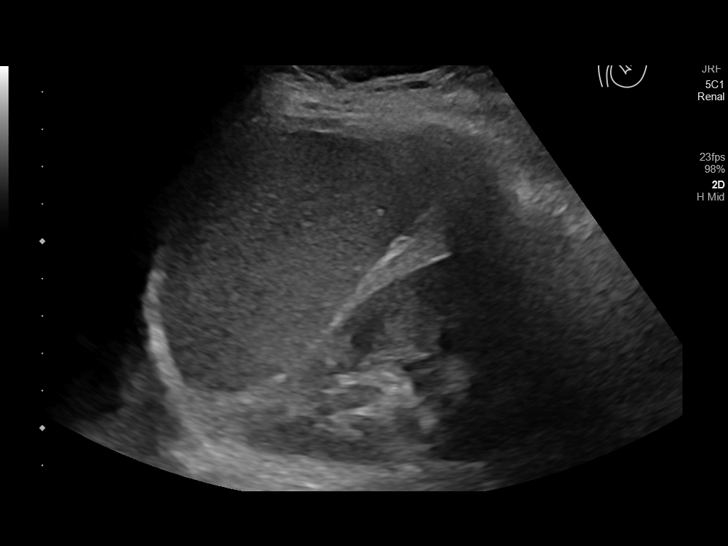
[im 20/52]
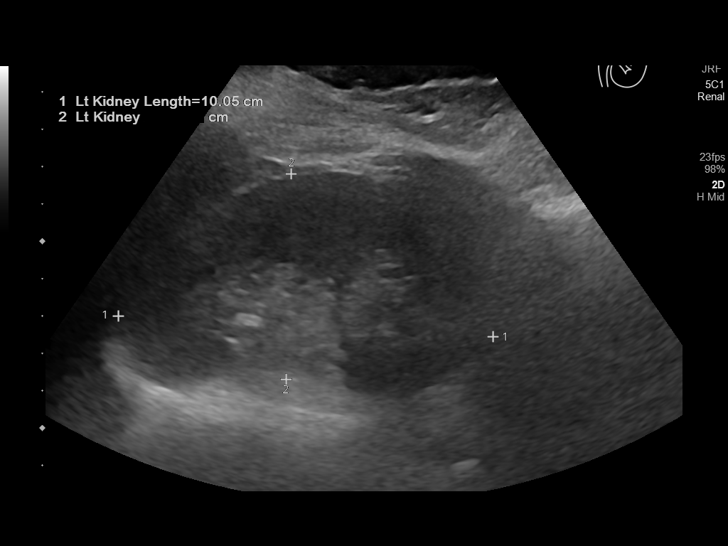
[im 24/52]
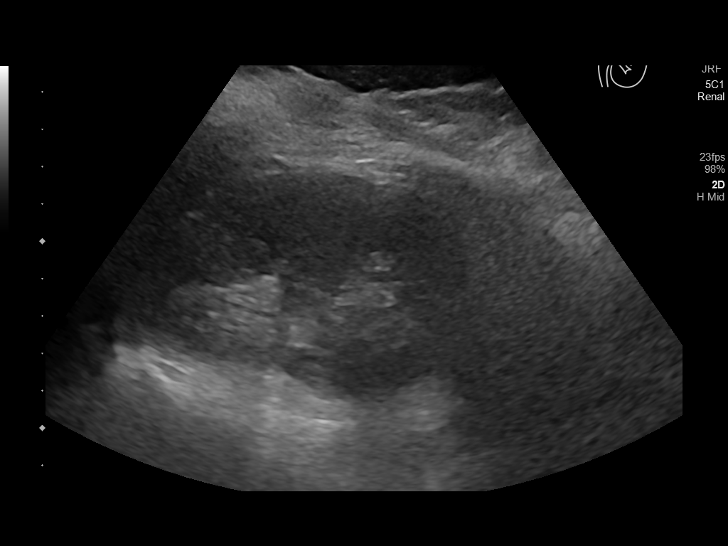
[im 28/52]
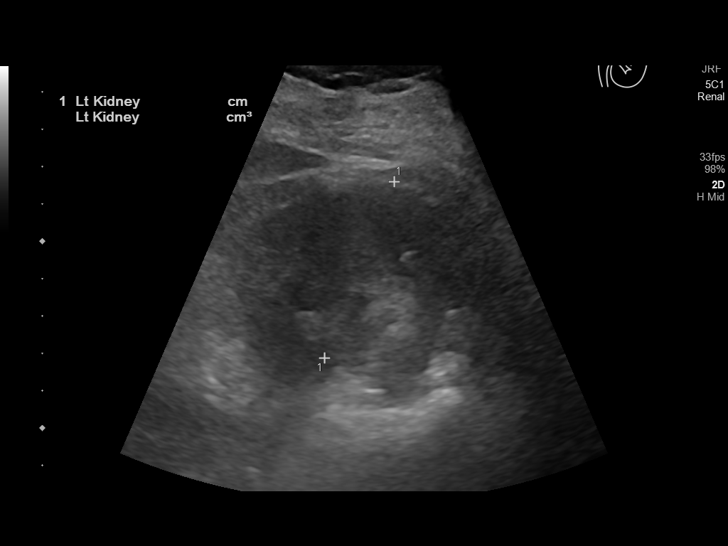
[im 32/52]
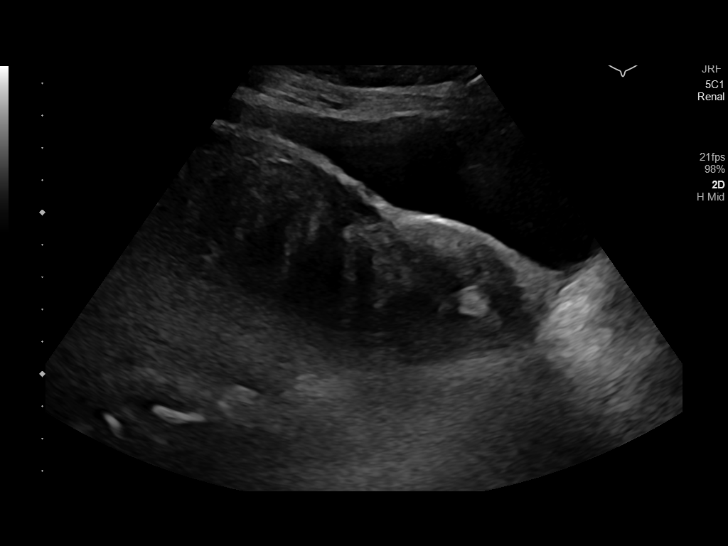
[im 35/52]
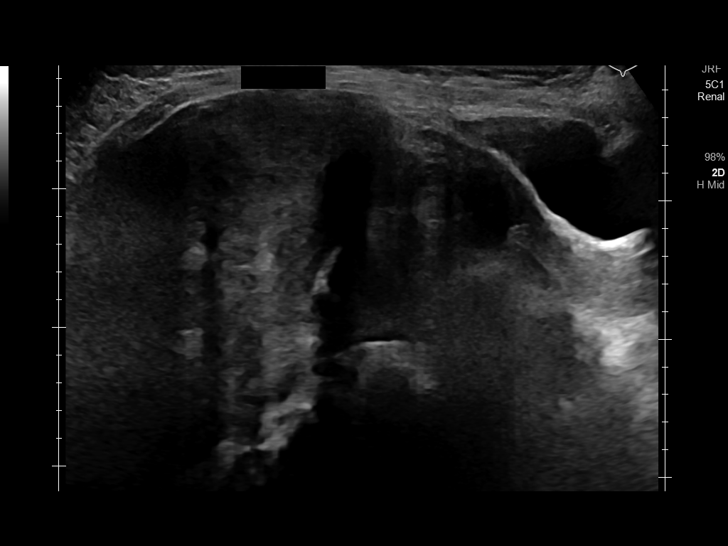
[im 39/52]
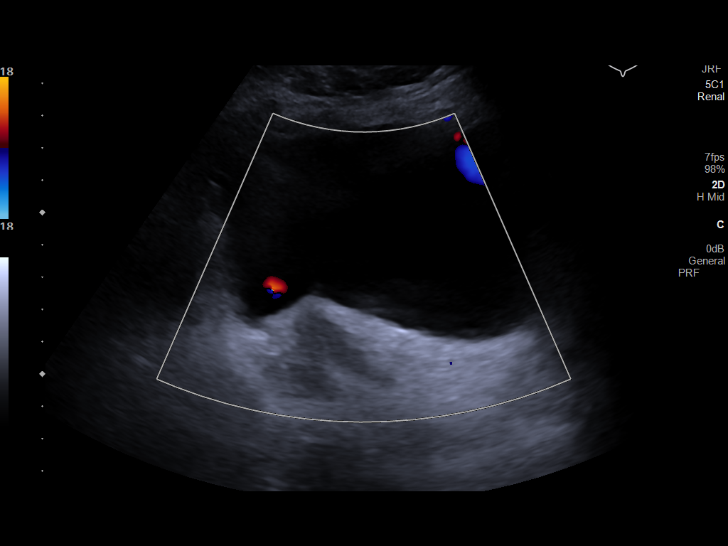
[im 43/52]
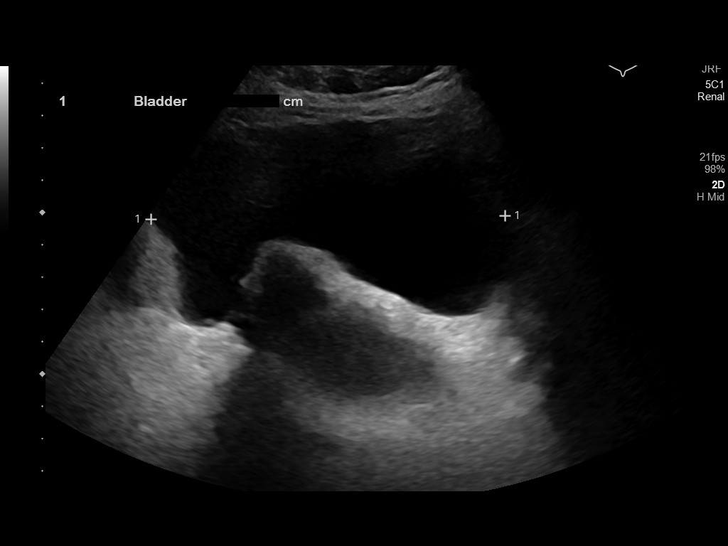
[im 47/52]
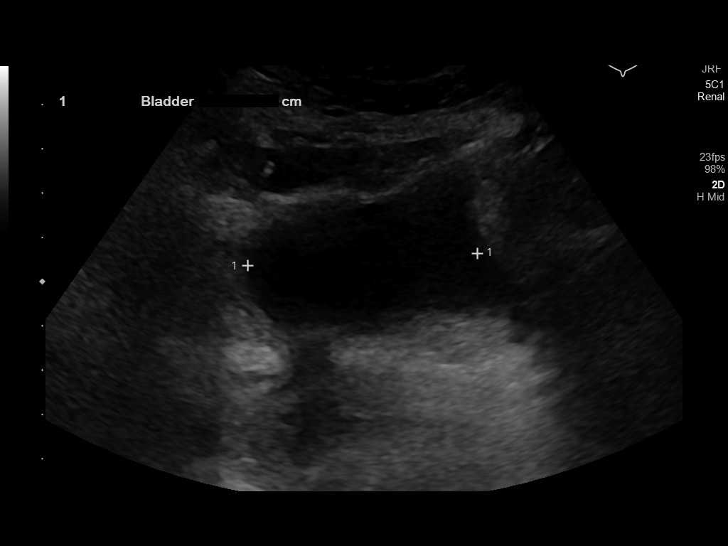
[im 52/52]
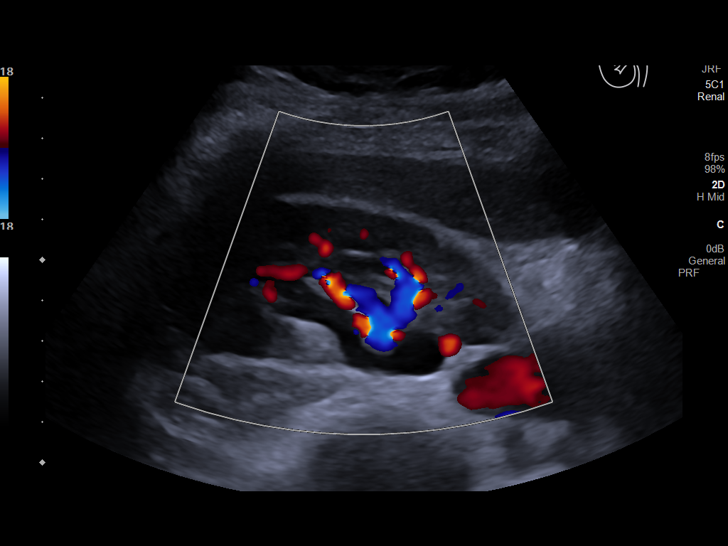

[14 of 25 positions shown; findings below may reference images not displayed]

FINDINGS: Right Kidney:

Renal measurements: 10.8 x 5.6 x 5.3 cm = volume: 168 mL. Mild right
hydronephrosis. No mass.

Left Kidney:

Renal measurements: 10.1 x 5.5 x 5.1 cm = volume: 147 mL.
Echogenicity within normal limits. No mass or hydronephrosis
visualized.

Bladder:

Appears normal for degree of bladder distention.

Other:

Enlarged fibroid uterus partially visualized.
IMPRESSION: 1. Mild right hydronephrosis.
2. No significant abnormality of the left kidney.

## 2024-03-20 IMAGING — CT CT ABD-PELV W/ CM
2 of 4 series · 15 of 46 positions shown, 17 images · IV contrast (agent unspecified)
Comparison: Renal ultrasound 03/24/2022

CLINICAL DATA: Surgical planning, uterine fibroids, hydronephrosis.

EXAM:
CT ABDOMEN AND PELVIS WITH CONTRAST
TECHNIQUE: Multidetector CT imaging of the abdomen and pelvis was performed
using the standard protocol following bolus administration of
intravenous contrast.

[Series 2: abd pelvis 5.00 · axial · 0.63mm/px · z∈[-1578,-1163]mm · 12 of 91 slices shown, 14 images]
[im 4/91  soft-tissue]
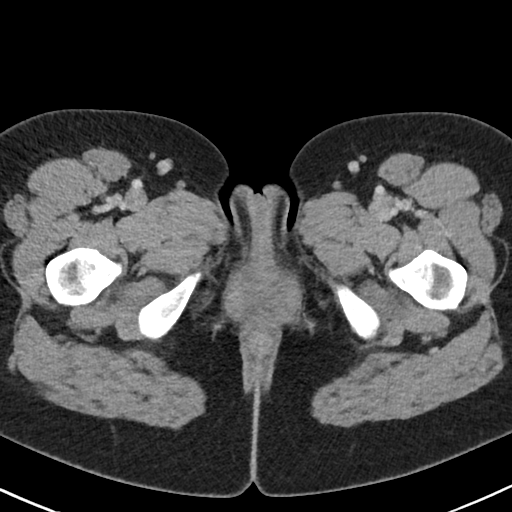
[im 4/91  bone]
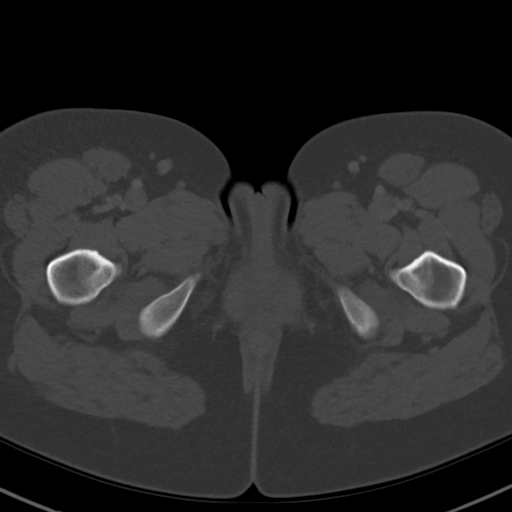
[im 12/91  soft-tissue]
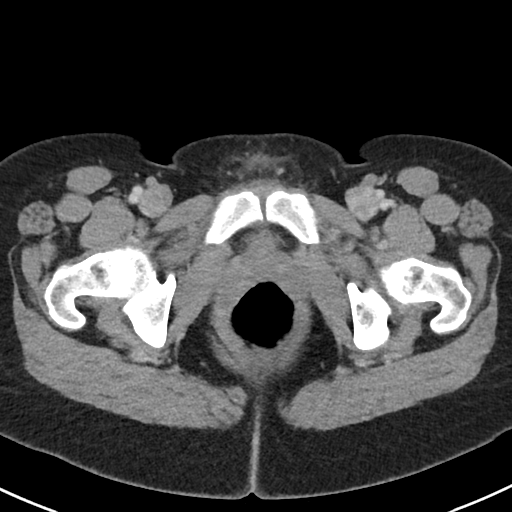
[im 20/91  soft-tissue]
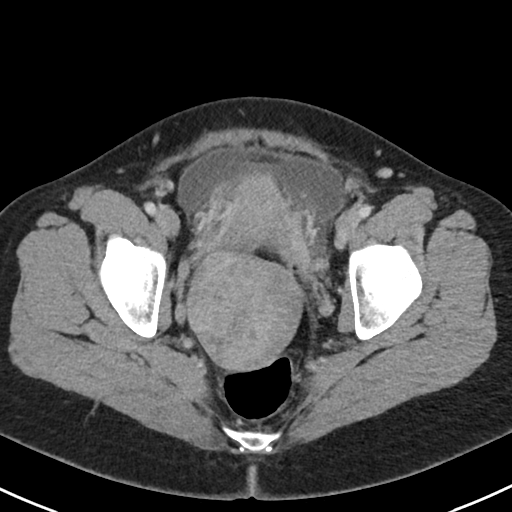
[im 28/91  soft-tissue]
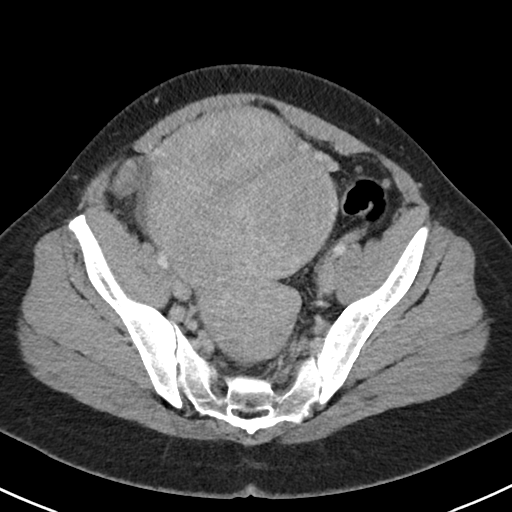
[im 36/91  soft-tissue]
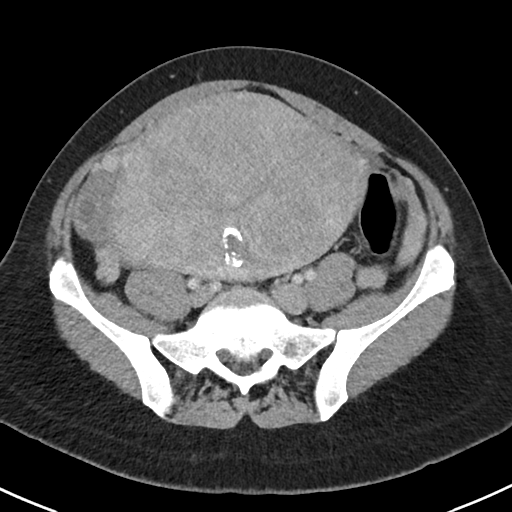
[im 44/91  soft-tissue]
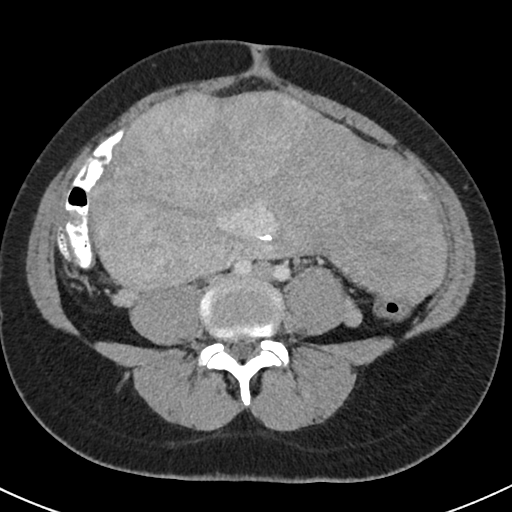
[im 47/91  soft-tissue]
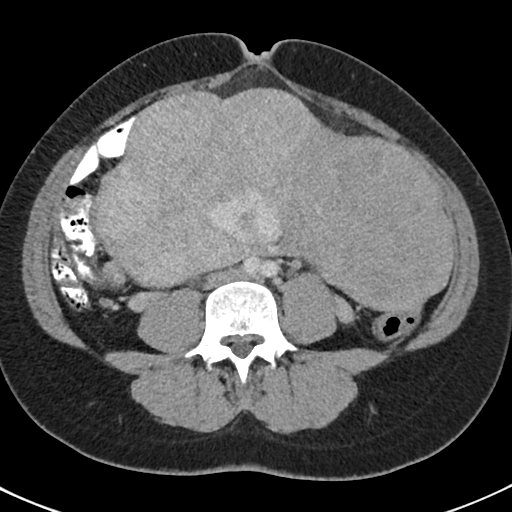
[im 55/91  soft-tissue]
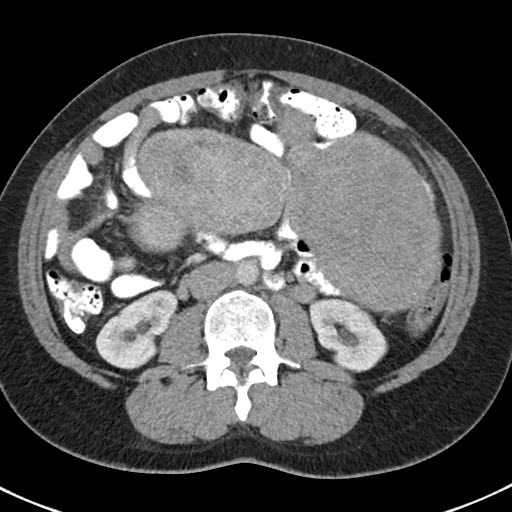
[im 63/91  soft-tissue]
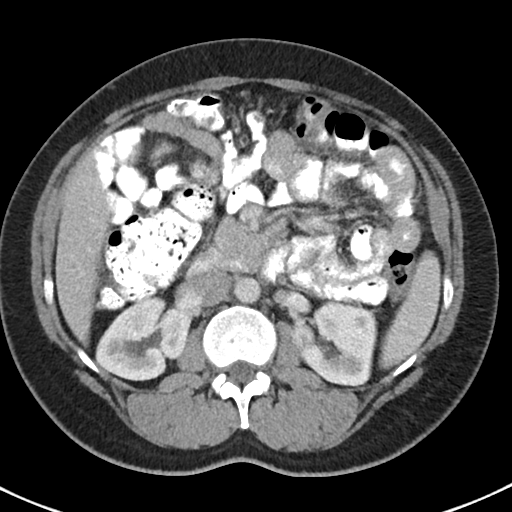
[im 63/91  bone]
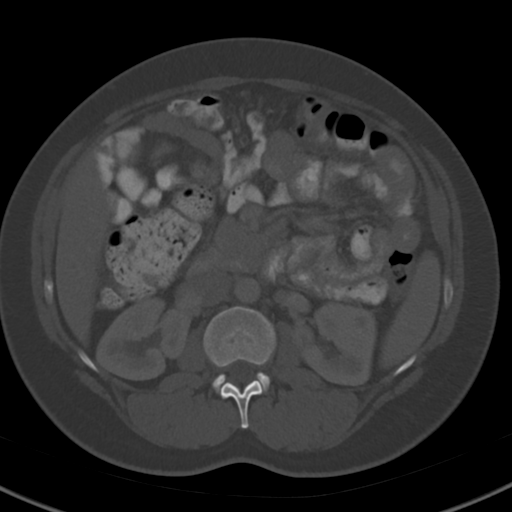
[im 71/91  soft-tissue]
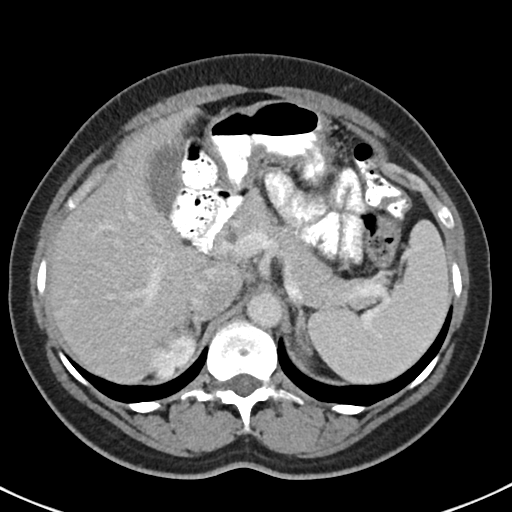
[im 79/91  soft-tissue]
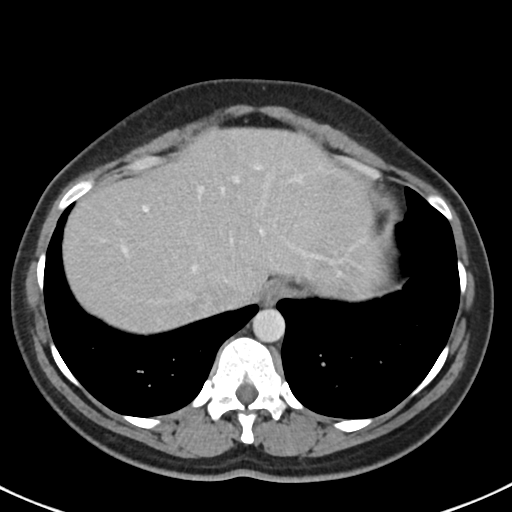
[im 87/91  soft-tissue]
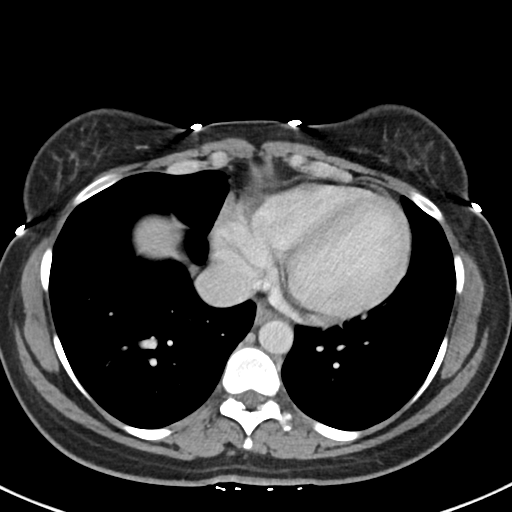

[Series 4: coronals abd pelvis 2.00 cor · coronal · 0.63mm/px · 3 of 145 slices shown]
[im 49/145  soft-tissue]
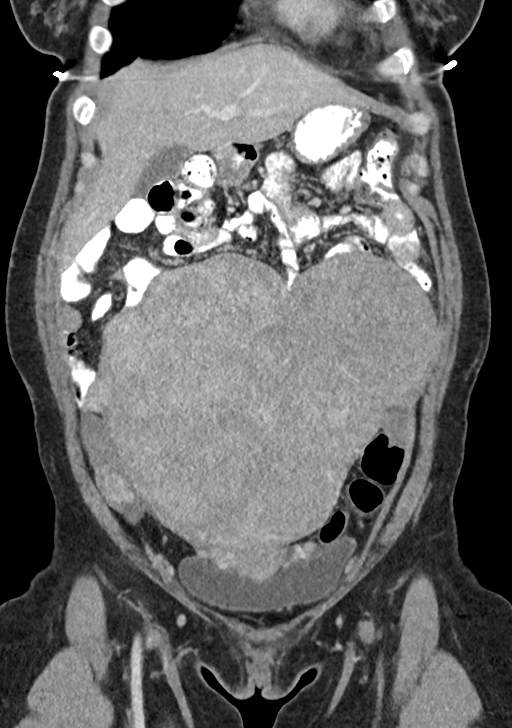
[im 65/145  soft-tissue]
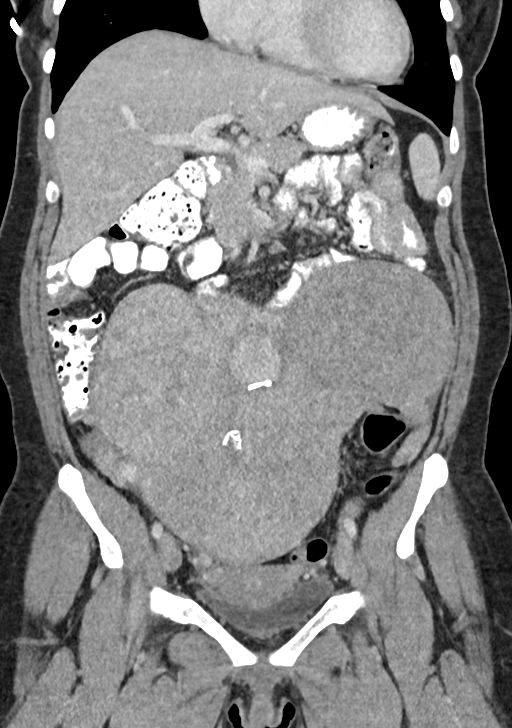
[im 81/145  soft-tissue]
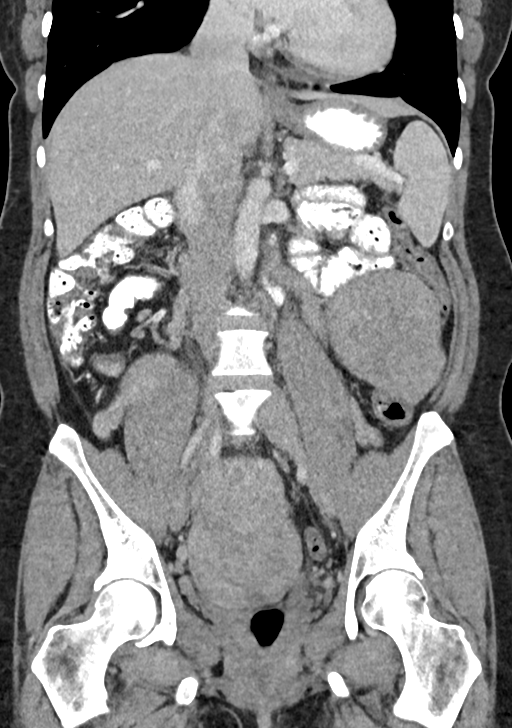

[15 of 46 positions shown; findings below may reference images not displayed]

RADIATION DOSE REDUCTION: This exam was performed according to the
departmental dose-optimization program which includes automated
exposure control, adjustment of the mA and/or kV according to
patient size and/or use of iterative reconstruction technique.

CONTRAST:  100mL OMNIPAQUE IOHEXOL 300 MG/ML  SOLN
FINDINGS: Lower chest: No acute abnormality.

Hepatobiliary: No focal liver abnormality is seen. No gallstones,
gallbladder wall thickening, or biliary dilatation.

Pancreas: Unremarkable. No pancreatic ductal dilatation or
surrounding inflammatory changes.

Spleen: Normal in size without focal abnormality.

Adrenals/Urinary Tract: Adrenal glands appear normal. No suspicious
renal mass identified bilaterally. Mild right and minimal left
hydronephrosis, likely secondary to uterine compression on the
ureters. Urinary bladder appears within normal limits.

Stomach/Bowel: No bowel obstruction, free air or pneumatosis. No
bowel wall edema identified. Appendix is normal.

Vascular/Lymphatic: No significant vascular findings are present. No
enlarged abdominal or pelvic lymph nodes.

Reproductive: Uterus is lobulated, heterogeneous and markedly
enlarged measuring up to approximately 11.5 x 22.5 x 24 cm in AP,
transverse and fundal cervical dimensions. Largest appearing fibroid
near the fundus on the left measures up to 12.4 cm in size. Note is
made of an IUD within the uterus. No suspicious adnexal mass
visualized.

Other: No ascites.

Musculoskeletal: No acute or significant osseous findings.
IMPRESSION: 1. Markedly enlarged, lobulated fibroid uterus.
2. Mild right and minimal left hydronephrosis, likely secondary to
uterus compressing the ureters.

## 2024-05-30 ENCOUNTER — Encounter: Payer: Self-pay | Admitting: Family Medicine

## 2024-05-30 ENCOUNTER — Ambulatory Visit (INDEPENDENT_AMBULATORY_CARE_PROVIDER_SITE_OTHER): Admitting: Family Medicine

## 2024-05-30 VITALS — BP 124/68 | HR 73 | Resp 16 | Ht 66.0 in | Wt 182.0 lb

## 2024-05-30 DIAGNOSIS — Z90710 Acquired absence of both cervix and uterus: Secondary | ICD-10-CM

## 2024-05-30 DIAGNOSIS — E559 Vitamin D deficiency, unspecified: Secondary | ICD-10-CM | POA: Diagnosis not present

## 2024-05-30 DIAGNOSIS — R718 Other abnormality of red blood cells: Secondary | ICD-10-CM

## 2024-05-30 DIAGNOSIS — Z1231 Encounter for screening mammogram for malignant neoplasm of breast: Secondary | ICD-10-CM | POA: Diagnosis not present

## 2024-05-30 DIAGNOSIS — I10 Essential (primary) hypertension: Secondary | ICD-10-CM | POA: Diagnosis not present

## 2024-05-30 DIAGNOSIS — D509 Iron deficiency anemia, unspecified: Secondary | ICD-10-CM | POA: Diagnosis not present

## 2024-05-30 DIAGNOSIS — Z0000 Encounter for general adult medical examination without abnormal findings: Secondary | ICD-10-CM | POA: Diagnosis not present

## 2024-05-30 DIAGNOSIS — D259 Leiomyoma of uterus, unspecified: Secondary | ICD-10-CM | POA: Diagnosis not present

## 2024-05-30 NOTE — Patient Instructions (Addendum)
 [facility] [address], [place], [place] [address] Scheduling phone #: [phone]   Health Maintenance  Topic Date Due   COVID-19 Vaccine (3 - 2024-25 season) 06/14/2024*   Flu Shot  01/21/2025*   Hepatitis B Vaccine (1 of 3 - 19+ 3-dose series) 05/29/2025*   HPV Vaccine (1 - 3-dose SCDM series) 05/29/2025*   DTaP/Tdap/Td vaccine (5 - Td or Tdap) 05/16/2028   Hepatitis C Screening  Completed   HIV Screening  Completed   Meningitis B Vaccine  Aged Out  *Topic was postponed. The date shown is not the original due date.

## 2024-05-30 NOTE — Progress Notes (Signed)
 Patient: Alicia Larson, Female    DOB: 14-Nov-1985, 38 y.o.   MRN: [id number] Leavy Mole, PA-C Visit Date: 05/30/2024  Today's Provider: Mole Leavy, PA-C   Chief Complaint  Patient presents with   Annual Exam   Subjective:   Annual physical exam:   Alicia Larson is a 38 y.o. female who presents today for complete physical exam:  Exercise/Activity:  not working out Diet/nutrition:  eating healthier  Sleep:  good sleep   SDOH Screenings   Food Insecurity: No Food Insecurity (05/29/2024)  Housing: Low Risk  (05/30/2024)  Transportation Needs: No Transportation Needs (05/29/2024)  Utilities: Not At Risk (05/30/2024)  Alcohol Screen: Low Risk  (05/30/2024)  Depression (PHQ2-9): Low Risk  (05/30/2024)  Financial Resource Strain: Low Risk  (05/29/2024)  Physical Activity: Inactive (05/29/2024)  Social Connections: Socially Integrated (05/29/2024)  Stress: No Stress Concern Present (05/29/2024)  Tobacco Use: Low Risk  (05/30/2024)  Health Literacy: Adequate Health Literacy (05/30/2024)   Needs new GYN Some possible perimenopausal changes - some sweats at night, feeling more hot, skin changing with some hormonal acne s/p total hysterectomy 07/2022  HTN - no longer on Rx lisinopril  20 BP Readings from Last 3 Encounters:  05/30/24 124/68  04/21/23 132/86  04/19/23 (!) 159/96     USPSTF grade A and B recommendations - reviewed and addressed today  Depression:  Phq 9 completed today by patient, was reviewed by me with patient in the room PHQ score is neg, pt feels good    05/30/2024   12:55 PM 04/21/2023   12:51 PM 03/21/2023    9:34 AM 09/27/2022   10:13 AM  PHQ 2/9 Scores  PHQ - 2 Score 0 0 0 0  PHQ- 9 Score  0 0 0      05/30/2024   12:55 PM 04/21/2023   12:51 PM 03/21/2023    9:34 AM 09/27/2022   10:13 AM 03/15/2022   10:23 AM  Depression screen PHQ 2/9  Decreased Interest 0 0 0 0 0  Down, Depressed, Hopeless 0 0 0 0 0  PHQ - 2 Score 0 0 0 0 0  Altered sleeping  0 0 0 0   Tired, decreased energy  0 0 0 0  Change in appetite  0 0 0 0  Feeling bad or failure about yourself   0 0 0 0  Trouble concentrating  0 0 0 0  Moving slowly or fidgety/restless  0 0 0 0  Suicidal thoughts  0 0 0 0  PHQ-9 Score  0 0 0 0  Difficult doing work/chores  Not difficult at all Not difficult at all Not difficult at all     Alcohol screening: Flowsheet Row Office Visit from 05/30/2024 in [facility]  AUDIT-C Score 0    Immunizations and Health Maintenance: Health Maintenance  Topic Date Due   MAMMOGRAM  Never done   COVID-19 Vaccine (3 - 2024-25 season) 06/14/2024 (Originally 06/25/2023)   INFLUENZA VACCINE  01/21/2025 (Originally 05/24/2024)   Hepatitis B Vaccines (1 of 3 - 19+ 3-dose series) 05/29/2025 (Originally 05/17/2003)   HPV VACCINES (1 - 3-dose SCDM series) 05/29/2025 (Originally 05/17/2011)   DTaP/Tdap/Td (5 - Td or Tdap) 05/16/2028   Hepatitis C Screening  Completed   HIV Screening  Completed   Meningococcal B Vaccine  Aged Out     Hep C Screening: done  STD testing and prevention (HIV/chl/gon/syphilis):  see above, no additional testing desired by pt today  Intimate partner violence:safe  Sexual History/Pain during Intercourse: Significant Other  Menstrual History/LMP/Abnormal Bleeding: none Patient's last menstrual period was 05/16/2022 (approximate).  Incontinence Symptoms: none  Breast cancer:  Due  Last Mammogram: *see HM list above BRCA gene screening: none known  Cervical cancer screening: N/A due to TAH  Osteoporosis:   Discussion on osteoporosis per age, including high calcium and vitamin D  supplementation, weight bearing exercises  Skin cancer:  Hx of skin CA -  NO Discussed atypical lesions   Colorectal cancer:   Colonoscopy is not due per age   Discussed concerning signs and sx of CRC, pt denies change in bowels, blood in stool  Lung cancer:   Low Dose CT Chest recommended if Age 61-80 years, 20 pack-year  currently smoking OR have quit w/in 15years. Patient does not qualify.    Social History   Tobacco Use   Smoking status: Never    Passive exposure: Never   Smokeless tobacco: Never  Vaping Use   Vaping status: Never Used  Substance Use Topics   Alcohol use: Not Currently    Comment: ocassionally (maybe 1 drink 3-4 x a month)   Drug use: Never     Flowsheet Row Office Visit from 05/30/2024 in [facility]  AUDIT-C Score 0    Family History  Problem Relation Age of Onset   Diabetes Mother    Hypertension Mother    Hypertension Maternal Grandmother    Hyperlipidemia Maternal Grandfather    Hypertension Maternal Grandfather    Heart disease Maternal Grandfather    Leukemia Maternal Uncle      Blood pressure/Hypertension: BP Readings from Last 3 Encounters:  05/30/24 124/68  04/21/23 132/86  04/19/23 (!) 159/96    Weight/Obesity: Wt Readings from Last 3 Encounters:  05/30/24 182 lb (82.6 kg)  04/21/23 170 lb 8 oz (77.3 kg)  04/19/23 170 lb (77.1 kg)   BMI Readings from Last 3 Encounters:  05/30/24 29.38 kg/m  04/21/23 26.90 kg/m  04/19/23 27.44 kg/m     Lipids:  Lab Results  Component Value Date   CHOL 158 03/21/2023   CHOL 149 03/15/2022   CHOL 161 03/09/2021   Lab Results  Component Value Date   HDL 57 03/21/2023   HDL 50 03/15/2022   HDL 59 03/09/2021   Lab Results  Component Value Date   LDLCALC 89 03/21/2023   LDLCALC 86 03/15/2022   LDLCALC 92 03/09/2021   Lab Results  Component Value Date   TRIG 41 03/21/2023   TRIG 46 03/15/2022   TRIG 35 03/09/2021   Lab Results  Component Value Date   CHOLHDL 2.8 03/21/2023   CHOLHDL 3.0 03/15/2022   CHOLHDL 2.7 03/09/2021   No results found for: LDLDIRECT Based on the results of lipid panel his/her cardiovascular risk factor ( using Poole Cohort )  in the next 10 years is: The 10-year ASCVD risk score (Arnett DK, et al., 2019) is: 0.4%   Values used to calculate the  score:     Age: 27 years     Clincally relevant sex: Female     Is Non-Hispanic African American: Yes     Diabetic: No     Tobacco smoker: No     Systolic Blood Pressure: 124 mmHg     Is BP treated: No     HDL Cholesterol: 57 mg/dL     Total Cholesterol: 158 mg/dL  Glucose:  Glucose, Bld  Date Value Ref Range Status  03/21/2023 78 65 - 99 mg/dL  Final    Comment:    .            Fasting reference interval .   08/09/2022 90 70 - 99 mg/dL Final    Comment:    Glucose reference range applies only to samples taken after fasting for at least 8 hours.  07/18/2022 98 70 - 99 mg/dL Final    Comment:    Glucose reference range applies only to samples taken after fasting for at least 8 hours.    Advanced Care Planning:  A voluntary discussion about advance care planning including the explanation and discussion of advance directives.   Discussed health care proxy and Living will, and the patient was able to identify a health care proxy as spouse.   Patient does not have a living will at present time.   Social History       Social History   Socioeconomic History   Marital status: Significant Other    Spouse name: Not on file   Number of children: 0   Years of education: 16   Highest education level: Master's degree (e.g., MA, MS, MEng, MEd, MSW, MBA)  Occupational History    Comment: Nurse  Tobacco Use   Smoking status: Never    Passive exposure: Never   Smokeless tobacco: Never  Vaping Use   Vaping status: Never Used  Substance and Sexual Activity   Alcohol use: Not Currently    Comment: ocassionally (maybe 1 drink 3-4 x a month)   Drug use: Never   Sexual activity: Not Currently    Partners: Male    Birth control/protection: None  Other Topics Concern   Not on file  Social History Narrative   Not on file   Social Drivers of Health   Financial Resource Strain: Low Risk  (05/29/2024)   Overall Financial Resource Strain (CARDIA)    Difficulty of Paying Living  Expenses: Not hard at all  Food Insecurity: No Food Insecurity (05/29/2024)   Hunger Vital Sign    Worried About Running Out of Food in the Last Year: Never true    Ran Out of Food in the Last Year: Never true  Transportation Needs: No Transportation Needs (05/29/2024)   PRAPARE - Administrator, Civil Service (Medical): No    Lack of Transportation (Non-Medical): No  Physical Activity: Inactive (05/29/2024)   Exercise Vital Sign    Days of Exercise per Week: 0 days    Minutes of Exercise per Session: Not on file  Stress: No Stress Concern Present (05/29/2024)   [employer] of Occupational Health - Occupational Stress Questionnaire    Feeling of Stress: Not at all  Social Connections: Socially Integrated (05/29/2024)   Social Connection and Isolation Panel    Frequency of Communication with Friends and Family: More than three times a week    Frequency of Social Gatherings with Friends and Family: Once a week    Attends Religious Services: More than 4 times per year    Active Member of [employer] or Organizations: Yes    Attends Banker Meetings: 1 to 4 times per year    Marital Status: Living with partner    Family History        Family History  Problem Relation Age of Onset   Diabetes Mother    Hypertension Mother    Hypertension Maternal Grandmother    Hyperlipidemia Maternal Grandfather    Hypertension Maternal Grandfather    Heart disease Maternal Grandfather  Leukemia Maternal Uncle     Patient Active Problem List   Diagnosis Date Noted   Primary hypertension 09/28/2022   Anemia 09/28/2022   Status post hysterectomy 08/09/2022   Iron  deficiency anemia 01/25/2018    Past Surgical History:  Procedure Laterality Date   ABDOMINAL HYSTERECTOMY N/A 08/09/2022   Procedure: HYSTERECTOMY ABDOMINAL;  Surgeon: Corene Coy, MD;  Location: [facility] OR;  Service: Gynecology;  Laterality: N/A;  TAP BLOCK   BILATERAL SALPINGECTOMY  08/09/2022    Procedure: OPEN BILATERAL SALPINGECTOMY;  Surgeon: Corene Coy, MD;  Location: [facility] OR;  Service: Gynecology;;   WISDOM TOOTH EXTRACTION  10/24/2006    No current outpatient medications on file.  No Known Allergies  Patient Care Team: Adanna Zuckerman, PA-C as PCP - General (Family Medicine)   Chart Review: I personally reviewed active problem list, medication list, allergies, family history, social history, health maintenance, notes from last encounter, lab results, imaging with the patient/caregiver today.   Review of Systems  Constitutional: Negative.   HENT: Negative.    Eyes: Negative.   Respiratory: Negative.    Cardiovascular: Negative.   Gastrointestinal: Negative.   Endocrine: Negative.   Genitourinary: Negative.   Musculoskeletal: Negative.   Skin: Negative.   Allergic/Immunologic: Negative.   Neurological: Negative.   Hematological: Negative.   Psychiatric/Behavioral: Negative.    All other systems reviewed and are negative.         Objective:   Vitals:  Vitals:   05/30/24 1256  BP: 124/68  Pulse: 73  Resp: 16  SpO2: 98%  Weight: 182 lb (82.6 kg)  Height: 5' 6 (1.676 m)    Body mass index is 29.38 kg/m.  Physical Exam Vitals and nursing note reviewed.  Constitutional:      General: She is not in acute distress.    Appearance: Normal appearance. She is well-developed, well-groomed and overweight. She is not ill-appearing, toxic-appearing or diaphoretic.  HENT:     Head: Normocephalic and atraumatic.     Right Ear: Tympanic membrane, ear canal and external ear normal. There is no impacted cerumen.     Left Ear: Tympanic membrane, ear canal and external ear normal. There is no impacted cerumen.     Nose: Nose normal. No congestion or rhinorrhea.     Mouth/Throat:     Mouth: Mucous membranes are moist.     Pharynx: Oropharynx is clear. Uvula midline. No oropharyngeal exudate or posterior oropharyngeal erythema.  Eyes:     General: Lids  are normal. No scleral icterus.       Right eye: No discharge.        Left eye: No discharge.     Conjunctiva/sclera: Conjunctivae normal.  Neck:     Trachea: Phonation normal. No tracheal deviation.  Cardiovascular:     Rate and Rhythm: Normal rate and regular rhythm.     Pulses: Normal pulses.          Radial pulses are 2+ on the right side and 2+ on the left side.       Posterior tibial pulses are 2+ on the right side and 2+ on the left side.     Heart sounds: Normal heart sounds. No murmur heard.    No friction rub. No gallop.  Pulmonary:     Effort: Pulmonary effort is normal. No respiratory distress.     Breath sounds: Normal breath sounds. No stridor. No wheezing, rhonchi or rales.  Chest:     Chest wall: No tenderness.  Abdominal:  General: Bowel sounds are normal. There is no distension.     Palpations: Abdomen is soft.     Tenderness: There is no abdominal tenderness. There is no guarding or rebound.  Musculoskeletal:        General: No deformity.     Cervical back: Normal range of motion and neck supple.  Lymphadenopathy:     Cervical: No cervical adenopathy.  Skin:    General: Skin is warm and dry.     Coloration: Skin is not pale.     Findings: No rash.  Neurological:     Mental Status: She is alert. Mental status is at baseline.     Motor: No abnormal muscle tone.     Coordination: Coordination normal.     Gait: Gait normal.  Psychiatric:        Mood and Affect: Mood normal.        Speech: Speech normal.        Behavior: Behavior normal. Behavior is cooperative.       Fall Risk:    05/30/2024   12:55 PM 04/21/2023   12:51 PM 03/21/2023    9:34 AM 09/27/2022   10:12 AM 03/15/2022   10:20 AM  Fall Risk   Falls in the past year? 0 0 0 0 0  Number falls in past yr: 0 0 0 0   Injury with Fall? 0 0 0 0   Risk for fall due to : No Fall Risks No Fall Risks No Fall Risks No Fall Risks No Fall Risks  Follow up Falls prevention discussed Falls prevention  discussed;Education provided;Falls evaluation completed Falls prevention discussed;Education provided;Falls evaluation completed Falls prevention discussed;Education provided;Falls evaluation completed  Falls prevention discussed;Falls evaluation completed      Data saved with a previous flowsheet row definition    Functional Status Survey: Is the patient deaf or have difficulty hearing?: No Does the patient have difficulty seeing, even when wearing glasses/contacts?: No Does the patient have difficulty concentrating, remembering, or making decisions?: No Does the patient have difficulty walking or climbing stairs?: No Does the patient have difficulty dressing or bathing?: No Does the patient have difficulty doing errands alone such as visiting a doctor's office or shopping?: No   Assessment & Plan:    CPE completed today  USPSTF grade A and B recommendations reviewed with patient; age-appropriate recommendations, preventive care, screening tests, etc discussed and encouraged; healthy living encouraged; see AVS for patient education given to patient  Discussed importance of 150 minutes of physical activity weekly, AHA exercise recommendations given to pt in AVS/handout  Discussed importance of healthy diet:  eating lean meats and proteins, avoiding trans fats and saturated fats, avoid simple sugars and excessive carbs in diet, eat 6 servings of fruit/vegetables daily and drink plenty of water and avoid sweet beverages.    Recommended pt to do annual eye exam and routine dental exams/cleanings  Depression, alcohol, fall screening completed as documented above and per flowsheets  Advance Care planning information and packet discussed and offered today, encouraged pt to discuss with family members/spouse/partner/friends and complete Advanced directive packet and bring copy to office   Reviewed Health Maintenance: Health Maintenance  Topic Date Due   MAMMOGRAM  Never done   COVID-19  Vaccine (3 - 2024-25 season) 06/14/2024 (Originally 06/25/2023)   INFLUENZA VACCINE  01/21/2025 (Originally 05/24/2024)   Hepatitis B Vaccines (1 of 3 - 19+ 3-dose series) 05/29/2025 (Originally 05/17/2003)   HPV VACCINES (1 - 3-dose SCDM series) 05/29/2025 (Originally  05/17/2011)   DTaP/Tdap/Td (5 - Td or Tdap) 05/16/2028   Hepatitis C Screening  Completed   HIV Screening  Completed   Meningococcal B Vaccine  Aged Out    Immunizations: Immunization History  Administered Date(s) Administered   DTP 06/07/1989   Hep B, Unspecified 08/09/1993, 09/09/1993, 02/17/1994   Influenza, Seasonal, Injecte, Preservative Fre 08/11/2023   Influenza-Unspecified 07/25/2015, 07/18/2019, 07/22/2021, 08/08/2022   MMR 12/20/2001   Meningococcal Conjugate 01/25/2006   OPV 06/07/1989   PFIZER(Purple Top)SARS-COV-2 Vaccination 06/05/2020, 06/26/2020   PPD Test 09/28/2021   Td 12/20/2001   Tdap 10/24/2005, 05/16/2018   Vaccines:  HPV: up to at age 69 , ask insurance if age between 30-45  Shingrix: 25-64 yo and ask insurance if covered when patient above 34 yo Pneumonia: n/a educated and discussed with patient. Flu: annually -  educated and discussed with patient.     ICD-10-CM   1. Well adult exam  Z00.00 CBC with Differential/Platelet    HgB A1c    Comprehensive Metabolic Panel (CMET)    Lipid Profile    Iron , TIBC and Ferritin Panel    MM 3D SCREENING MAMMOGRAM BILATERAL BREAST    TSH    2. Iron  deficiency anemia, unspecified iron  deficiency anemia type  D50.9 CBC with Differential/Platelet    Iron , TIBC and Ferritin Panel   recheck labs    3. Vitamin D  deficiency  E55.9 VITAMIN D  25 Hydroxy (Vit-D Deficiency, Fractures)    4. Encounter for screening mammogram for malignant neoplasm of breast  Z12.31 MM 3D SCREENING MAMMOGRAM BILATERAL BREAST    5. Uterine leiomyoma, unspecified location  D25.9 Ambulatory referral to Obstetrics / Gynecology    6. Status post hysterectomy  Z90.710 Ambulatory  referral to Obstetrics / Gynecology    7. Primary hypertension  I10    off meds for several months and BP at goal, she can continue to manage with diet/lifestyle, f/up if BP >130/80     Womans health - discussed pelvic floor changes, perimenopausal sx and encouraged her to come in for eval/assessment if she has sx that are bothersome or disruptive to health, sleep, mood.       Michelene Cower, PA-C 05/30/24 5:26 PM  [facility]

## 2024-05-31 ENCOUNTER — Encounter: Payer: Self-pay | Admitting: Family Medicine

## 2024-05-31 ENCOUNTER — Ambulatory Visit: Payer: Self-pay | Admitting: Family Medicine

## 2024-05-31 LAB — CBC WITH DIFFERENTIAL/PLATELET
Absolute Lymphocytes: 1553 {cells}/uL (ref 850–3900)
Absolute Monocytes: 466 {cells}/uL (ref 200–950)
Basophils Absolute: 10 {cells}/uL (ref 0–200)
Basophils Relative: 0.2 %
Eosinophils Absolute: 39 {cells}/uL (ref 15–500)
Eosinophils Relative: 0.8 %
HCT: 40.9 % (ref 35.0–45.0)
Hemoglobin: 11.9 g/dL (ref 11.7–15.5)
MCH: 22.3 pg — ABNORMAL LOW (ref 27.0–33.0)
MCHC: 29.1 g/dL — ABNORMAL LOW (ref 32.0–36.0)
MCV: 76.7 fL — ABNORMAL LOW (ref 80.0–100.0)
MPV: 11.9 fL (ref 7.5–12.5)
Monocytes Relative: 9.5 %
Neutro Abs: 2832 {cells}/uL (ref 1500–7800)
Neutrophils Relative %: 57.8 %
Platelets: 295 Thousand/uL (ref 140–400)
RBC: 5.33 Million/uL — ABNORMAL HIGH (ref 3.80–5.10)
RDW: 12.9 % (ref 11.0–15.0)
Total Lymphocyte: 31.7 %
WBC: 4.9 Thousand/uL (ref 3.8–10.8)

## 2024-05-31 LAB — COMPREHENSIVE METABOLIC PANEL WITH GFR
AG Ratio: 1.4 (calc) (ref 1.0–2.5)
ALT: 13 U/L (ref 6–29)
AST: 16 U/L (ref 10–30)
Albumin: 4.4 g/dL (ref 3.6–5.1)
Alkaline phosphatase (APISO): 89 U/L (ref 31–125)
BUN: 8 mg/dL (ref 7–25)
CO2: 25 mmol/L (ref 20–32)
Calcium: 9.5 mg/dL (ref 8.6–10.2)
Chloride: 105 mmol/L (ref 98–110)
Creat: 0.7 mg/dL (ref 0.50–0.99)
Globulin: 3.1 g/dL (ref 1.9–3.7)
Glucose, Bld: 75 mg/dL (ref 65–99)
Potassium: 3.8 mmol/L (ref 3.5–5.3)
Sodium: 139 mmol/L (ref 135–146)
Total Bilirubin: 0.5 mg/dL (ref 0.2–1.2)
Total Protein: 7.5 g/dL (ref 6.1–8.1)
eGFR: 112 mL/min/1.73m2 (ref >=60)

## 2024-05-31 LAB — IRON,TIBC AND FERRITIN PANEL
%SAT: 33 % (ref 16–45)
Ferritin: 11 ng/mL — ABNORMAL LOW (ref 16–154)
Iron: 129 ug/dL (ref 40–190)
TIBC: 388 ug/dL (ref 250–450)

## 2024-05-31 LAB — LIPID PANEL
Cholesterol: 146 mg/dL (ref <200)
HDL: 54 mg/dL (ref >=50)
LDL Cholesterol (Calc): 79 mg/dL
Non-HDL Cholesterol (Calc): 92 mg/dL (ref <130)
Total CHOL/HDL Ratio: 2.7 (calc) (ref <5.0)
Triglycerides: 44 mg/dL (ref <150)

## 2024-05-31 LAB — HEMOGLOBIN A1C
Hgb A1c MFr Bld: 5.5 % (ref <5.7)
Mean Plasma Glucose: 111 mg/dL
eAG (mmol/L): 6.2 mmol/L

## 2024-05-31 LAB — VITAMIN D 25 HYDROXY (VIT D DEFICIENCY, FRACTURES): Vit D, 25-Hydroxy: 25 ng/mL — ABNORMAL LOW (ref 30–100)

## 2024-05-31 LAB — TSH: TSH: 0.87 m[IU]/L

## 2024-06-20 ENCOUNTER — Ambulatory Visit: Admitting: Obstetrics and Gynecology

## 2024-06-20 ENCOUNTER — Encounter: Payer: Self-pay | Admitting: Obstetrics and Gynecology

## 2024-06-20 VITALS — BP 126/86 | HR 79 | Ht 66.0 in | Wt 177.2 lb

## 2024-06-20 DIAGNOSIS — Z01419 Encounter for gynecological examination (general) (routine) without abnormal findings: Secondary | ICD-10-CM

## 2024-06-20 DIAGNOSIS — Z7689 Persons encountering health services in other specified circumstances: Secondary | ICD-10-CM

## 2024-06-20 NOTE — Progress Notes (Signed)
 Patients presents for annual exam today. She states doing well but wanting to have an exam post TAH in 2023. Annual labs and mammogram are ordered by PCP. She states no other questions or concerns at this time.

## 2024-06-20 NOTE — Progress Notes (Signed)
 HPI:      Alicia Larson is a 38 y.o. G0P0000 who LMP was Patient's last menstrual period was 05/16/2022 (approximate).  Subjective:   She presents today for her well woman visit.  She had an abdominal hysterectomy 2 years ago for large uterine fibroids and heavy bleeding.  She reports that she was doing much better since that time and has no complaints today.  She denies hot flashes or other symptoms.  She is not currently sexually active.    Hx: The following portions of the patient's history were reviewed and updated as appropriate:             She  has a past medical history of Anemia, Dermatitis (05/16/2013), Elevated blood protein (01/25/2018), Fibroids (09/16/2015), Fibroids, intramural (08/09/2022), Hydronephrosis of right kidney, Hypertension, and Menorrhagia (09/03/2015). She does not have any pertinent problems on file. She  has a past surgical history that includes Wisdom tooth extraction (10/24/2006); Abdominal hysterectomy (N/A, 08/09/2022); and Bilateral salpingectomy (08/09/2022). Her family history includes Diabetes in her mother; Heart disease in her maternal grandfather; Hyperlipidemia in her maternal grandfather; Hypertension in her maternal grandfather, maternal grandmother, and mother; Leukemia in her maternal uncle. She  reports that she has never smoked. She has never been exposed to tobacco smoke. She has never used smokeless tobacco. She reports that she does not currently use alcohol. She reports that she does not use drugs. She has a current medication list which includes the following prescription(s): vitamin d  and iron . She has no known allergies.       Review of Systems:  Review of Systems  Constitutional: Denied constitutional symptoms, night sweats, recent illness, fatigue, fever, insomnia and weight loss.  Eyes: Denied eye symptoms, eye pain, photophobia, vision change and visual disturbance.  Ears/Nose/Throat/Neck: Denied ear, nose, throat or neck  symptoms, hearing loss, nasal discharge, sinus congestion and sore throat.  Cardiovascular: Denied cardiovascular symptoms, arrhythmia, chest pain/pressure, edema, exercise intolerance, orthopnea and palpitations.  Respiratory: Denied pulmonary symptoms, asthma, pleuritic pain, productive sputum, cough, dyspnea and wheezing.  Gastrointestinal: Denied, gastro-esophageal reflux, melena, nausea and vomiting.  Genitourinary: Denied genitourinary symptoms including symptomatic vaginal discharge, pelvic relaxation issues, and urinary complaints.  Musculoskeletal: Denied musculoskeletal symptoms, stiffness, swelling, muscle weakness and myalgia.  Dermatologic: Denied dermatology symptoms, rash and scar.  Neurologic: Denied neurology symptoms, dizziness, headache, neck pain and syncope.  Psychiatric: Denied psychiatric symptoms, anxiety and depression.  Endocrine: Denied endocrine symptoms including hot flashes and night sweats.   Meds:   Current Outpatient Medications on File Prior to Visit  Medication Sig Dispense Refill   Cholecalciferol (VITAMIN D ) 50 MCG (2000 UT) CAPS      Ferrous Sulfate (IRON ) 325 (65 Fe) MG TABS      No current facility-administered medications on file prior to visit.     Objective:     Vitals:   06/20/24 0811  BP: 126/86  Pulse: 79    Filed Weights   06/20/24 0811  Weight: 177 lb 3.2 oz (80.4 kg)              Physical examination General NAD, Conversant  HEENT Atraumatic; Op clear with mmm.  Normo-cephalic.  Anicteric sclerae  Thyroid /Neck Smooth without nodularity or enlargement. Normal ROM.  Neck Supple.  Skin No rashes, lesions or ulceration. Normal palpated skin turgor. No nodularity.  Breasts: No masses or discharge.  Symmetric.  No axillary adenopathy.  Lungs: Clear to auscultation.No rales or wheezes. Normal Respiratory effort, no retractions.  Heart: NSR.  No murmurs or rubs appreciated. No peripheral edema  Abdomen: Soft.  Non-tender.  No masses.   No HSM. No hernia  Extremities: Moves all appropriately.  Normal ROM for age. No lymphadenopathy.  Neuro: Oriented to PPT.  Normal mood. Normal affect.     Pelvic:   Vulva: Normal appearance.  No lesions.   Vagina: No lesions or abnormalities noted.  Support: Normal pelvic support.  Urethra No masses tenderness or scarring.  Meatus Normal size without lesions or prolapse.  Cervix: Surgically absent   Anus: Normal exam.  No lesions.  Perineum: Normal exam.  No lesions.        Bimanual   Uterus: Surgically absent   Adnexae: No masses.  Non-tender to palpation.  Cul-de-sac: Negative for abnormality.     Assessment:    G0P0000 Patient Active Problem List   Diagnosis Date Noted   Primary hypertension 09/28/2022   Anemia 09/28/2022   Status post hysterectomy 08/09/2022   Iron  deficiency anemia 01/25/2018     1. Establishing care with new doctor, encounter for   2. Well woman exam with routine gynecological exam     Normal exam   Plan:            1.  Basic Screening Recommendations The basic screening recommendations for asymptomatic women were discussed with the patient during her visit.  The age-appropriate recommendations were discussed with her and the rational for the tests reviewed.  When I am informed by the patient that another primary care physician has previously obtained the age-appropriate tests and they are up-to-date, only outstanding tests are ordered and referrals given as necessary.  Abnormal results of tests will be discussed with her when all of her results are completed.  Routine preventative health maintenance measures emphasized: Exercise/Diet/Weight control, Tobacco Warnings, Alcohol/Substance use risks and Stress Management  Orders No orders of the defined types were placed in this encounter.   No orders of the defined types were placed in this encounter.       F/U  Return in about 1 year (around 06/20/2025) for Annual [facility] visit.  Alm DOROTHA Sar,  M.D. 06/20/2024 8:29 AM

## 2024-06-26 ENCOUNTER — Encounter: Admitting: Family Medicine

## 2024-07-29 ENCOUNTER — Ambulatory Visit
Admission: RE | Admit: 2024-07-29 | Discharge: 2024-07-29 | Disposition: A | Discharge status: Home / Self Care | Source: Ambulatory Visit | Attending: Family Medicine | Admitting: Family Medicine

## 2024-07-29 ENCOUNTER — Encounter: Payer: Self-pay | Admitting: Oncology

## 2024-07-29 ENCOUNTER — Other Ambulatory Visit: Payer: Self-pay

## 2024-07-29 DIAGNOSIS — Z1231 Encounter for screening mammogram for malignant neoplasm of breast: Secondary | ICD-10-CM | POA: Insufficient documentation

## 2024-07-29 DIAGNOSIS — Z0000 Encounter for general adult medical examination without abnormal findings: Secondary | ICD-10-CM

## 2024-07-29 MED ORDER — FLUZONE 0.5 ML IM SUSY
0.5000 mL | PREFILLED_SYRINGE | Freq: Once | INTRAMUSCULAR | 0 refills | Status: AC
  Filled 2024-07-29: qty 0.5, 1d supply, fill #0

## 2024-12-02 ENCOUNTER — Ambulatory Visit: Admitting: Family Medicine
